# Patient Record
Sex: Female | Born: 1935 | Race: White | Hispanic: No | Smoking: Never smoker
Health system: Southern US, Community
[De-identification: ages and names within clinical notes are randomized; demographics above are authoritative.]

## PROBLEM LIST (undated history)

## (undated) ENCOUNTER — Ambulatory Visit: Admission: EM | Discharge status: Against Medical Advice | Payer: Medicare HMO

## (undated) DIAGNOSIS — I10 Essential (primary) hypertension: Secondary | ICD-10-CM

## (undated) DIAGNOSIS — R51 Headache: Secondary | ICD-10-CM

## (undated) DIAGNOSIS — G2581 Restless legs syndrome: Secondary | ICD-10-CM

## (undated) DIAGNOSIS — R519 Headache, unspecified: Secondary | ICD-10-CM

## (undated) DIAGNOSIS — R7303 Prediabetes: Secondary | ICD-10-CM

## (undated) DIAGNOSIS — N390 Urinary tract infection, site not specified: Secondary | ICD-10-CM

## (undated) DIAGNOSIS — K219 Gastro-esophageal reflux disease without esophagitis: Secondary | ICD-10-CM

## (undated) HISTORY — DX: Gastro-esophageal reflux disease without esophagitis: K21.9

## (undated) HISTORY — DX: Headache, unspecified: R51.9

## (undated) HISTORY — DX: Essential (primary) hypertension: I10

## (undated) HISTORY — DX: Restless legs syndrome: G25.81

## (undated) HISTORY — DX: Prediabetes: R73.03

## (undated) HISTORY — DX: Headache: R51

## (undated) HISTORY — DX: Urinary tract infection, site not specified: N39.0

---

## 1977-12-22 HISTORY — PX: PARTIAL HYSTERECTOMY: SHX80

## 2006-05-05 ENCOUNTER — Ambulatory Visit: Payer: Self-pay | Admitting: Family Medicine

## 2007-07-21 ENCOUNTER — Ambulatory Visit: Payer: Self-pay | Admitting: Family Medicine

## 2007-07-26 ENCOUNTER — Ambulatory Visit: Payer: Self-pay | Admitting: Family Medicine

## 2007-09-24 ENCOUNTER — Encounter: Admission: RE | Admit: 2007-09-24 | Discharge: 2007-09-24 | Payer: Self-pay | Admitting: Surgery

## 2008-06-07 ENCOUNTER — Encounter: Admission: RE | Admit: 2008-06-07 | Discharge: 2008-06-07 | Payer: Self-pay | Admitting: Surgery

## 2009-12-22 HISTORY — PX: BREAST BIOPSY: SHX20

## 2011-01-12 ENCOUNTER — Encounter: Payer: Self-pay | Admitting: Surgery

## 2013-09-15 ENCOUNTER — Telehealth: Payer: Self-pay | Admitting: *Deleted

## 2013-09-15 ENCOUNTER — Emergency Department: Payer: Self-pay | Admitting: Emergency Medicine

## 2013-09-15 DIAGNOSIS — R079 Chest pain, unspecified: Secondary | ICD-10-CM

## 2013-09-15 LAB — COMPREHENSIVE METABOLIC PANEL
Alkaline Phosphatase: 98 U/L (ref 50–136)
Anion Gap: 7 (ref 7–16)
BUN: 24 mg/dL — ABNORMAL HIGH (ref 7–18)
Calcium, Total: 9.3 mg/dL (ref 8.5–10.1)
Chloride: 106 mmol/L (ref 98–107)
Co2: 25 mmol/L (ref 21–32)
EGFR (African American): >60
EGFR (Non-African Amer.): 58 — ABNORMAL LOW
Glucose: 91 mg/dL (ref 65–99)
Potassium: 4 mmol/L (ref 3.5–5.1)
SGPT (ALT): 33 U/L (ref 12–78)
Sodium: 138 mmol/L (ref 136–145)

## 2013-09-15 LAB — TROPONIN I: Troponin-I: <0.02 ng/mL

## 2013-09-15 LAB — CBC
HGB: 14 g/dL (ref 12.0–16.0)
MCH: 31.8 pg (ref 26.0–34.0)

## 2013-09-15 LAB — PROTIME-INR: INR: 0.9

## 2013-09-15 LAB — CK TOTAL AND CKMB (NOT AT ARMC): CK-MB: 1.1 ng/mL (ref 0.5–3.6)

## 2013-09-15 NOTE — Telephone Encounter (Signed)
Per Dr. Mariah Milling, patient in the ER at [facility] today. Will need a GXT tomorrow if she can walk, if not, she will need a formal consult instead. OK to schedule at 4pm per Dr. Mariah Milling. I spoke with the patient, and she is ok to walk. She is agreeable to coming in a 4 pm tomorrow for a GXT. She states she is on no medications. Instructed her to not eat/ drink for 3 hours prior. She voices understanding.

## 2013-09-16 ENCOUNTER — Encounter: Payer: Self-pay | Admitting: Cardiovascular Disease

## 2013-09-19 ENCOUNTER — Encounter: Payer: Self-pay | Admitting: Cardiovascular Disease

## 2017-07-27 ENCOUNTER — Ambulatory Visit (INDEPENDENT_AMBULATORY_CARE_PROVIDER_SITE_OTHER): Payer: Medicare Other | Admitting: Primary Care

## 2017-07-27 ENCOUNTER — Encounter: Payer: Self-pay | Admitting: Primary Care

## 2017-07-27 ENCOUNTER — Ambulatory Visit: Payer: Self-pay | Admitting: Internal Medicine

## 2017-07-27 VITALS — BP 130/84 | HR 94 | Temp 98.4°F | Ht 62.5 in | Wt 171.1 lb

## 2017-07-27 DIAGNOSIS — Z1239 Encounter for other screening for malignant neoplasm of breast: Secondary | ICD-10-CM

## 2017-07-27 DIAGNOSIS — I10 Essential (primary) hypertension: Secondary | ICD-10-CM | POA: Diagnosis not present

## 2017-07-27 DIAGNOSIS — R51 Headache: Secondary | ICD-10-CM

## 2017-07-27 DIAGNOSIS — K219 Gastro-esophageal reflux disease without esophagitis: Secondary | ICD-10-CM | POA: Diagnosis not present

## 2017-07-27 DIAGNOSIS — R519 Headache, unspecified: Secondary | ICD-10-CM | POA: Insufficient documentation

## 2017-07-27 DIAGNOSIS — Z1231 Encounter for screening mammogram for malignant neoplasm of breast: Secondary | ICD-10-CM

## 2017-07-27 MED ORDER — HYDROCHLOROTHIAZIDE 12.5 MG PO TABS: 12.5000 mg | ORAL_TABLET | Freq: Every day | ORAL | 0 refills | Status: DC

## 2017-07-27 MED ORDER — PANTOPRAZOLE SODIUM 20 MG PO TBEC: 20.0000 mg | DELAYED_RELEASE_TABLET | Freq: Every day | ORAL | 0 refills | Status: DC

## 2017-07-27 MED ORDER — TOPIRAMATE 25 MG PO TABS: ORAL_TABLET | ORAL | 0 refills | Status: DC

## 2017-07-27 NOTE — Assessment & Plan Note (Signed)
Stable in the office today, however, do suspect Losartan to be the culprit of her cough. Will stop Losartan, start low dose HCTZ. Follow up in 2 weeks for BP check and BMP.

## 2017-07-27 NOTE — Assessment & Plan Note (Signed)
Long term management on pantoprazole 40 mg. Will reduce dose to 20 mg to see if this is tolerable. She will update if symptoms return.

## 2017-07-27 NOTE — Progress Notes (Signed)
   Subjective:    Patient ID: Renee Gilbert, female    DOB: 03/29/1937, 81 y.o.   MRN: [id number]  HPI  Ms. Renee Gilbert is a 81 year old female who presents today to establish care and discuss the problems mentioned below. Will obtain old records. She is due for her screening mammogram and would like this done soon. Last mammogram completed in [place] and was unremarkable.   1) Essential Hypertension: Currently managed on losartan 50 mg intermittently since 2014, restarted consistent use in 2016. Her BP in the office today is 130/84. She has noticed a tickle to her through with persistent cough during long conversations.   2) GERD: Currently managed on pantoprazole 40 mg. She will experience symptoms of esophageal burning. She's never tried a dose reduction, has always taken this medication. She's not had her pantoprazole today, denies symptoms today.   3) Headaches: Located to the frontal lobe bilaterally for years. Headaches are occurring every other day. She will take Aspirin without improvement. She can't tolerate ibuprofen or Excedrin Migraine.  Never been on management for her chronic headaches. She will experience photophobia, phonophobia intermittently with headaches.    4) Cough: Coughs during a phone conversation or when she's in the middle of a long conversation. Cough present for the past 4-5 months. She underwent xray evaluation last month and was told she had asthma. She denies history of asthma. She denies fevers, chills, chest congestion, fatigue, shortness of breath.  Review of Systems  Constitutional: Negative for fever.  HENT: Negative for congestion and sore throat.   Respiratory: Positive for cough.   Cardiovascular: Negative for chest pain.  Gastrointestinal:       Gerd   Neurological: Positive for headaches.       No past medical history on file.   Social History   Social History  . Marital status: Widowed    Spouse name: N/A  . Number of children: N/A  . Years  of education: N/A   Occupational History  . Not on file.   Social History Main Topics  . Smoking status: Never Smoker  . Smokeless tobacco: Never Used  . Alcohol use No  . Drug use: Unknown  . Sexual activity: Not on file   Other Topics Concern  . Not on file   Social History Narrative  . No narrative on file    No past surgical history on file.  No family history on file.  No Known Allergies  No current outpatient prescriptions on file prior to visit.   No current facility-administered medications on file prior to visit.     BP 130/84   Pulse 94   Temp 98.4 F (36.9 C) (Oral)   Ht 5' 2.5" (1.588 m)   Wt 171 lb 1.9 oz (77.6 kg)   SpO2 93%   BMI 30.80 kg/m    Objective:   Physical Exam  Constitutional: She appears well-nourished. She does not appear ill.  HENT:  Mouth/Throat: Oropharynx is clear and moist.  Neck: Neck supple.  Cardiovascular: Normal rate and regular rhythm.   Pulmonary/Chest: Effort normal and breath sounds normal. She has no wheezes.  Skin: Skin is warm and dry.  Psychiatric: She has a normal mood and affect.          Assessment & Plan:

## 2017-07-27 NOTE — Patient Instructions (Signed)
Stop losartan 50 mg tablets for blood pressure, this is likely causing your cough.  Start hydrochlorothiazide 12.5 mg for blood pressure. Take 1 tablet by mouth once daily.  We've reduced the dose of your acid reflux medication, pantoprazole, to 20 mg. Please call me if you notice a return in your symptoms.  Start topirimate 25 mg tablets for chronic headaches. Take 1 tablet by mouth at bedtime for headache prevention. Start this in 5 days to prevent potential side effects with starting hydrochlorothiazide for blood pressure.  Schedule a follow up visit with me in 2 weeks for blood pressure check.  It was a pleasure to meet you today! Please don't hesitate to call me with any questions. Welcome to [facility]!

## 2017-07-27 NOTE — Assessment & Plan Note (Addendum)
Daily to every other day headaches for years, worse since moving to [place]. Discussed options for treatment, we will start with low dose Topamax HS. Discussed potential side effects. She will start this 5 days after starting HCTZ. Follow up in 2 weeks for re-evaluation.

## 2017-08-12 ENCOUNTER — Ambulatory Visit: Payer: Medicare Other | Admitting: Primary Care

## 2017-08-17 ENCOUNTER — Telehealth: Payer: Self-pay

## 2017-08-17 DIAGNOSIS — I10 Essential (primary) hypertension: Secondary | ICD-10-CM

## 2017-08-17 NOTE — Telephone Encounter (Signed)
Pt left v/m; pt established care on 07/27/17 and BP med changed to HCTZ which makes pt sick. Pt wants to d/c HCTZ and either take losartan or lisinopril. Pt had f/u appt on 08/12/17 but pt cancelled due to being to sick to come in. No future appt scheduled.Please advise.[facility].

## 2017-08-17 NOTE — Telephone Encounter (Signed)
Left message on patient's voicemail to return call

## 2017-08-17 NOTE — Telephone Encounter (Signed)
Please have her stop HCTZ and schedule a follow up visit in my office to discuss other options. Did the cough improve after she stopped Losartan?

## 2017-08-18 MED ORDER — LOSARTAN POTASSIUM 50 MG PO TABS: 50.0000 mg | ORAL_TABLET | Freq: Every day | ORAL | 0 refills | Status: DC

## 2017-08-18 NOTE — Telephone Encounter (Signed)
Noted, Rx sent to pharmacy. 

## 2017-08-18 NOTE — Telephone Encounter (Signed)
Spoken to patient. Patient stated that she have not been coughing. Patient stated that she is unable to schedule an appointment at this time.  Patient ask for Jae Dire to send in the Lisinopril or Losartan to the pharmacy for her.  Patient stated that she will schedule a office visit as soon as she can. She stated that if she have problems, she will let Jae Dire know.

## 2017-08-18 NOTE — Telephone Encounter (Signed)
Please notify patient that the losartan was likely the cause of her cough, this is a common side effect of this medication. If the cough is tolerable then we can restart her losartan, but I do recommend we consider other treatment if the cough is bothersome. Please let me know what she decides.

## 2017-08-18 NOTE — Telephone Encounter (Signed)
Spoken and notified patient of Kate's comments. Patient verbalized understanding.  Patient stated that she is agreeable to re-start the Losartan and patient ask to send to [facility] in [facility]

## 2017-10-22 ENCOUNTER — Other Ambulatory Visit: Payer: Self-pay | Admitting: Primary Care

## 2017-10-22 DIAGNOSIS — K219 Gastro-esophageal reflux disease without esophagitis: Secondary | ICD-10-CM

## 2017-11-23 ENCOUNTER — Other Ambulatory Visit: Payer: Self-pay | Admitting: Primary Care

## 2017-11-23 DIAGNOSIS — I10 Essential (primary) hypertension: Secondary | ICD-10-CM

## 2017-11-23 NOTE — Telephone Encounter (Signed)
Copied from CRM [id number]. Topic: General - Other >> Nov 23, 2017  5:43 PM Stephannie LiSimmons, Nieves Chapa L, NT wrote: [facility] [place] called in regards to losartin needing to be renewed  please call [phone] fax # [phone]

## 2017-11-24 MED ORDER — LOSARTAN POTASSIUM 50 MG PO TABS: 50.0000 mg | ORAL_TABLET | Freq: Every day | ORAL | 1 refills | Status: DC

## 2017-11-24 NOTE — Telephone Encounter (Signed)
Refill sent to pharmacy.   

## 2017-11-24 NOTE — Telephone Encounter (Signed)
Pt established care 07/27/17; please see 08/17/17 phone note; pt has not rescheduled f/u appt.Please advise.

## 2018-04-22 ENCOUNTER — Other Ambulatory Visit: Payer: Self-pay | Admitting: Primary Care

## 2018-04-22 DIAGNOSIS — K219 Gastro-esophageal reflux disease without esophagitis: Secondary | ICD-10-CM

## 2018-04-27 ENCOUNTER — Other Ambulatory Visit: Payer: Self-pay | Admitting: Primary Care

## 2018-04-27 DIAGNOSIS — K219 Gastro-esophageal reflux disease without esophagitis: Secondary | ICD-10-CM

## 2018-04-30 ENCOUNTER — Telehealth: Payer: Self-pay | Admitting: Primary Care

## 2018-04-30 DIAGNOSIS — K219 Gastro-esophageal reflux disease without esophagitis: Secondary | ICD-10-CM

## 2018-04-30 MED ORDER — PANTOPRAZOLE SODIUM 20 MG PO TBEC: 20.0000 mg | DELAYED_RELEASE_TABLET | Freq: Every day | ORAL | 0 refills | Status: DC

## 2018-04-30 NOTE — Telephone Encounter (Signed)
Copied from CRM [id number]. Topic: Quick Communication - Rx Refill/Question >> Apr 30, 2018 12:11 PM Alexander Bergeron B wrote: Medication: pantoprazole (PROTONIX) 20 MG tablet [[id number]]  Has the patient contacted their pharmacy? Yes.   (Agent: If no, request that the patient contact the pharmacy for the refill.) Preferred Pharm[facility] (with phone [facility] [place], [place] Agent: Please be advised that RX refills may take up to 3 business days. We ask that you follow-up with your pharmacy.

## 2018-07-24 ENCOUNTER — Other Ambulatory Visit: Payer: Self-pay | Admitting: Primary Care

## 2018-07-24 DIAGNOSIS — K219 Gastro-esophageal reflux disease without esophagitis: Secondary | ICD-10-CM

## 2018-07-28 ENCOUNTER — Other Ambulatory Visit: Payer: Self-pay | Admitting: Primary Care

## 2018-07-28 DIAGNOSIS — K219 Gastro-esophageal reflux disease without esophagitis: Secondary | ICD-10-CM

## 2018-07-30 ENCOUNTER — Other Ambulatory Visit: Payer: Self-pay | Admitting: Primary Care

## 2018-07-30 DIAGNOSIS — K219 Gastro-esophageal reflux disease without esophagitis: Secondary | ICD-10-CM

## 2018-11-04 ENCOUNTER — Other Ambulatory Visit: Payer: Self-pay | Admitting: Primary Care

## 2018-11-04 DIAGNOSIS — K219 Gastro-esophageal reflux disease without esophagitis: Secondary | ICD-10-CM

## 2018-11-04 NOTE — Telephone Encounter (Signed)
Name of Medication: pantoprazole 20 mg Name of Pharmacy: [facility] Last Fill or Written Date and Quantity: # 30 on 07/30/18 Last Office Visit and Type:07/27/17 established care  Next Office Visit and Type: none scheduled  Per 07/27/2017 office note pt was to FU appt in 2 weeks. No future appt scheduled. Please advise.

## 2018-11-05 NOTE — Telephone Encounter (Signed)
Refill request denied. Patient overdue for follow up and has not done so.

## 2018-11-17 ENCOUNTER — Ambulatory Visit: Payer: Medicare Other | Admitting: Primary Care

## 2018-11-17 ENCOUNTER — Telehealth: Payer: Self-pay

## 2018-11-17 NOTE — Telephone Encounter (Signed)
Patient called to cancel her appointment today with Jae DireKate and has it rescheduled for 11/24/18 but as it is for bp issues and leg pain, I will reach out to patient to be sure she is okay and doesn't need sooner f/u.

## 2018-11-17 NOTE — Telephone Encounter (Addendum)
Left message on cell VM (okay per DPR) for patient to please call back to discuss her symptoms to determine if patient may require a more urgent eval than to wait until 11/24/18.    She is on schedule for bp issues/leg pain with Jae DireKate for 12/4/ after calling and cancelling her appointment for today.

## 2018-11-24 ENCOUNTER — Telehealth: Payer: Self-pay

## 2018-11-24 ENCOUNTER — Ambulatory Visit (INDEPENDENT_AMBULATORY_CARE_PROVIDER_SITE_OTHER): Payer: Medicare Other | Admitting: Primary Care

## 2018-11-24 VITALS — BP 122/80 | HR 71 | Temp 98.1°F | Ht 62.5 in | Wt 177.8 lb

## 2018-11-24 DIAGNOSIS — I10 Essential (primary) hypertension: Secondary | ICD-10-CM

## 2018-11-24 DIAGNOSIS — R519 Headache, unspecified: Secondary | ICD-10-CM

## 2018-11-24 DIAGNOSIS — R252 Cramp and spasm: Secondary | ICD-10-CM

## 2018-11-24 DIAGNOSIS — K219 Gastro-esophageal reflux disease without esophagitis: Secondary | ICD-10-CM

## 2018-11-24 DIAGNOSIS — R202 Paresthesia of skin: Secondary | ICD-10-CM | POA: Diagnosis not present

## 2018-11-24 DIAGNOSIS — R200 Anesthesia of skin: Secondary | ICD-10-CM

## 2018-11-24 DIAGNOSIS — R51 Headache: Secondary | ICD-10-CM | POA: Diagnosis not present

## 2018-11-24 LAB — COMPREHENSIVE METABOLIC PANEL
ALT: 27 U/L (ref 0–35)
AST: 21 U/L (ref 0–37)
Albumin: 4.5 g/dL (ref 3.5–5.2)
Alkaline Phosphatase: 63 U/L (ref 39–117)
BUN: 25 mg/dL — AB (ref 6–23)
CO2: 26 mEq/L (ref 19–32)
CREATININE: 1.02 mg/dL (ref 0.40–1.20)
Calcium: 10.4 mg/dL (ref 8.4–10.5)
Chloride: 100 mEq/L (ref 96–112)
GFR: 55.33 mL/min — ABNORMAL LOW (ref >60.00)
GLUCOSE: 112 mg/dL — AB (ref 70–99)
POTASSIUM: 3.9 meq/L (ref 3.5–5.1)
SODIUM: 136 meq/L (ref 135–145)
TOTAL PROTEIN: 7.4 g/dL (ref 6.0–8.3)
Total Bilirubin: 0.5 mg/dL (ref 0.2–1.2)

## 2018-11-24 LAB — CBC
HEMATOCRIT: 38.7 % (ref 36.0–46.0)
Hemoglobin: 13.4 g/dL (ref 12.0–15.0)
MCHC: 34.7 g/dL (ref 30.0–36.0)
MCV: 90.9 fl (ref 78.0–100.0)
Platelets: 271 10*3/uL (ref 150.0–400.0)
RBC: 4.25 Mil/uL (ref 3.87–5.11)
RDW: 12.9 % (ref 11.5–15.5)
WBC: 7.4 10*3/uL (ref 4.0–10.5)

## 2018-11-24 LAB — VITAMIN B12: Vitamin B-12: 866 pg/mL (ref 211–911)

## 2018-11-24 MED ORDER — LOSARTAN POTASSIUM-HCTZ 100-25 MG PO TABS: 1.0000 | ORAL_TABLET | Freq: Every day | ORAL | 3 refills | Status: DC

## 2018-11-24 MED ORDER — MELOXICAM 15 MG PO TABS: ORAL_TABLET | ORAL | 1 refills | Status: DC

## 2018-11-24 MED ORDER — HYDROCHLOROTHIAZIDE 25 MG PO TABS: 25.0000 mg | ORAL_TABLET | Freq: Every day | ORAL | 3 refills | Status: DC

## 2018-11-24 MED ORDER — LOSARTAN POTASSIUM 100 MG PO TABS
100.0000 mg | ORAL_TABLET | Freq: Every day | ORAL | 3 refills | Status: DC
Start: 2018-11-24 — End: 2019-11-21

## 2018-11-24 MED ORDER — PANTOPRAZOLE SODIUM 40 MG PO TBEC: 40.0000 mg | DELAYED_RELEASE_TABLET | Freq: Every day | ORAL | 3 refills | Status: DC

## 2018-11-24 NOTE — Assessment & Plan Note (Signed)
Never picked up Topamax, does well with daily Ibuprofen. Discussed to stop Ibuprofen as we will be sending in Meloxicam.

## 2018-11-24 NOTE — Telephone Encounter (Signed)
Noted.  2 separate prescriptions sent to pharmacy.

## 2018-11-24 NOTE — Patient Instructions (Signed)
Stop by the lab prior to leaving today. I will notify you of your results once received.   We've increased the dose of your pantoprazole to 40 mg, this will help with heartburn.  Do not take Ibuprofen when taking Meloxicam. The Meloxicam can be taken once daily for pain/cramping. You may take Tylenol if needed.  It was a pleasure to see you today!

## 2018-11-24 NOTE — Progress Notes (Signed)
Subjective:    Patient ID: Renee Gilbert, female    DOB: 08/21/1936, 82 y.o.   MRN: [id number]  HPI  Renee Gilbert is an 82 year old female who was last seen in August 2018 who presents today with a chief complaint of lower extremity pain. She is also needing medication refills. She spends a lot of time in [place], has a PCP there as well.   BP Readings from Last 3 Encounters:  11/24/18 122/80  07/27/17 130/84   She reports bilateral lower extremity cramping that occurs most nights below the knees. This has been present for the last one year. She will wear compression socks at night which helps with cramping. She will take Ibuprofen 600 mg once nightly with improvement. She's taken some of her sister's Meloxicam 15 mg tablets once daily over the last two weeks and has found it to be very helpful with her cramping. She would like a prescription of her own.   She never picked up the prescription for topiramate, does continue to have daily headaches but will take Ibuprofen with relief.   Review of Systems  Eyes: Negative for visual disturbance.  Respiratory: Negative for shortness of breath.   Cardiovascular: Negative for chest pain.  Musculoskeletal: Positive for myalgias.  Neurological: Positive for headaches. Negative for dizziness.       Past Medical History:  Diagnosis Date  . Essential hypertension   . Frequent headaches   . GERD (gastroesophageal reflux disease)      Social History   Socioeconomic History  . Marital status: Widowed    Spouse name: Not on file  . Number of children: Not on file  . Years of education: Not on file  . Highest education level: Not on file  Occupational History  . Not on file  Social Needs  . Financial resource strain: Not on file  . Food insecurity:    Worry: Not on file    Inability: Not on file  . Transportation needs:    Medical: Not on file    Non-medical: Not on file  Tobacco Use  . Smoking status: Never Smoker  . Smokeless  tobacco: Never Used  Substance and Sexual Activity  . Alcohol use: No  . Drug use: Not on file  . Sexual activity: Not on file  Lifestyle  . Physical activity:    Days per week: Not on file    Minutes per session: Not on file  . Stress: Not on file  Relationships  . Social connections:    Talks on phone: Not on file    Gets together: Not on file    Attends religious service: Not on file    Active member of club or organization: Not on file    Attends meetings of clubs or organizations: Not on file    Relationship status: Not on file  . Intimate partner violence:    Fear of current or ex partner: Not on file    Emotionally abused: Not on file    Physically abused: Not on file    Forced sexual activity: Not on file  Other Topics Concern  . 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 Breast cancer Sister     No Known Allergies  Current Outpatient Medications on File Prior to Visit  Medication Sig Dispense  Refill  . ibuprofen (ADVIL,MOTRIN) 600 MG tablet Take 600 mg by mouth every 6 (six) hours as needed.    . topiramate (TOPAMAX) 25 MG tablet Take 1 tablet by mouth at bedtime for headache prevention. (Patient not taking: Reported on 11/24/2018) 90 tablet 0  . [DISCONTINUED] pantoprazole (PROTONIX) 20 MG tablet Take 1 tablet (20 mg total) by mouth daily. NEED APPOINTMENT FOR ANY MORE REFILLS 30 tablet 0   No current facility-administered medications on file prior to visit.     BP 122/80   Pulse 71   Temp 98.1 F (36.7 C) (Oral)   Ht 5' 2.5" (1.588 m)   Wt 177 lb 12 oz (80.6 kg)   SpO2 90%   BMI 31.99 kg/m    Objective:   Physical Exam  Constitutional: She is oriented to person, place, and time. She appears well-nourished.  Neck: Neck supple.  Cardiovascular: Normal rate and regular rhythm.  Pulses:      Dorsalis pedis pulses are 2+ on the right  side, and 2+ on the left side.       Posterior tibial pulses are 2+ on the right side, and 2+ on the left side.  Respiratory: Effort normal and breath sounds normal.  Musculoskeletal:  5/5 strength to bilateral lower extremities.   Neurological: She is alert and oriented to person, place, and time.  Skin: Skin is warm and dry.  Psychiatric: She has a normal mood and affect.           Assessment & Plan:

## 2018-11-24 NOTE — Assessment & Plan Note (Signed)
Stable in the office today. Is actually on losartan 100 mg and HCTZ 25 mg. Will refill today. BMP pending.

## 2018-11-24 NOTE — Telephone Encounter (Signed)
Nikki with [facility] said losartan HCTZ 100-25 mg is on BO and request 2 separate rx for losartan 100 mg and HCTZ 25 mg.

## 2018-11-24 NOTE — Assessment & Plan Note (Signed)
Chronic x 1 year, improved with compression socks and Meloxicam. No signs of intermittent claudication. Pedal pulses 2+ bilaterally.   Will send in meloxicam 15 mg, discussed to stop Ibuprofen. She will update. Labs pending including CMP, B12, CBC, A1C.

## 2018-11-24 NOTE — Assessment & Plan Note (Signed)
Continues to experience breakthrough esophageal burning on pantoprazole 20 mg. Will increase her dose of pantoprazole to 40 mg, this has been effective in the past.

## 2018-11-25 ENCOUNTER — Telehealth: Payer: Self-pay | Admitting: *Deleted

## 2018-11-25 NOTE — Telephone Encounter (Signed)
-----   Message from Doreene NestKatherine K Clark, NP sent at 11/24/2018  5:27 PM EST ----- Please notify patient:  Liver, electrolytes, vitamin B12, blood counts look good. There is a slight decrease in renal function, I do not have any recent prior labs to compare.  Medications like ibuprofen and meloxicam if taken often can be harmful to the kidneys.  Kidney function can decrease as we age in general. I recommend she discuss this with her primary care provider in [place] to see if she has a history of chronic kidney disease.  I would use caution with the meloxicam.

## 2018-11-25 NOTE — Telephone Encounter (Signed)
Message left for patient to return my call.  

## 2018-11-29 NOTE — Telephone Encounter (Signed)
Pt returned call to Chan. °

## 2018-11-29 NOTE — Telephone Encounter (Signed)
Spoken and notified patient of Kate Clark's comments. Patient verbalized understanding.  

## 2018-11-30 ENCOUNTER — Telehealth: Payer: Self-pay | Admitting: Primary Care

## 2018-11-30 DIAGNOSIS — R252 Cramp and spasm: Secondary | ICD-10-CM

## 2018-11-30 MED ORDER — GABAPENTIN 100 MG PO CAPS: 100.0000 mg | ORAL_CAPSULE | Freq: Every day | ORAL | 0 refills | Status: DC

## 2018-11-30 NOTE — Telephone Encounter (Signed)
Spoken and notified patient of Kate Clark's comments. Patient verbalized understanding.  

## 2018-11-30 NOTE — Telephone Encounter (Signed)
Best number [phone]  Pt called wanting to talk to kate Regarding her medication for pain

## 2018-11-30 NOTE — Telephone Encounter (Signed)
Please notify patient I will send in a prescription for gabapentin to the pharmacy for her pain.  She can take anywhere from 1 to 3 capsules at bedtime, have her start with 1.  This medication may make her drowsy.

## 2018-11-30 NOTE — Telephone Encounter (Signed)
Spoken to patient stated that she would like to know if Mayra ReelKate Clark can prescribed something for pain since she cannot take ibuprofen and meloxicam. Just a small supply. She will be going on the airplane day after tomorrow and need something for the flight. She will discuss with her other doctor regarding what she was told from us.

## 2019-11-21 ENCOUNTER — Other Ambulatory Visit: Payer: Self-pay | Admitting: Primary Care

## 2019-11-21 DIAGNOSIS — I10 Essential (primary) hypertension: Secondary | ICD-10-CM

## 2019-11-21 DIAGNOSIS — K219 Gastro-esophageal reflux disease without esophagitis: Secondary | ICD-10-CM

## 2019-12-24 ENCOUNTER — Other Ambulatory Visit: Payer: Self-pay | Admitting: Primary Care

## 2019-12-24 DIAGNOSIS — I10 Essential (primary) hypertension: Secondary | ICD-10-CM

## 2019-12-24 DIAGNOSIS — K219 Gastro-esophageal reflux disease without esophagitis: Secondary | ICD-10-CM

## 2019-12-27 ENCOUNTER — Other Ambulatory Visit: Payer: Self-pay | Admitting: Primary Care

## 2019-12-27 DIAGNOSIS — K219 Gastro-esophageal reflux disease without esophagitis: Secondary | ICD-10-CM

## 2019-12-27 DIAGNOSIS — I10 Essential (primary) hypertension: Secondary | ICD-10-CM

## 2019-12-29 ENCOUNTER — Other Ambulatory Visit: Payer: Self-pay | Admitting: Primary Care

## 2019-12-29 DIAGNOSIS — K219 Gastro-esophageal reflux disease without esophagitis: Secondary | ICD-10-CM

## 2019-12-29 DIAGNOSIS — I10 Essential (primary) hypertension: Secondary | ICD-10-CM

## 2020-04-10 ENCOUNTER — Ambulatory Visit (INDEPENDENT_AMBULATORY_CARE_PROVIDER_SITE_OTHER): Payer: Medicare HMO | Admitting: Primary Care

## 2020-04-10 ENCOUNTER — Other Ambulatory Visit: Payer: Self-pay

## 2020-04-10 ENCOUNTER — Encounter: Payer: Self-pay | Admitting: Primary Care

## 2020-04-10 ENCOUNTER — Telehealth: Payer: Self-pay

## 2020-04-10 VITALS — BP 150/90 | HR 76 | Temp 97.2°F | Ht 62.5 in | Wt 176.5 lb

## 2020-04-10 DIAGNOSIS — R921 Mammographic calcification found on diagnostic imaging of breast: Secondary | ICD-10-CM | POA: Diagnosis not present

## 2020-04-10 DIAGNOSIS — I10 Essential (primary) hypertension: Secondary | ICD-10-CM | POA: Diagnosis not present

## 2020-04-10 DIAGNOSIS — R7303 Prediabetes: Secondary | ICD-10-CM

## 2020-04-10 DIAGNOSIS — K219 Gastro-esophageal reflux disease without esophagitis: Secondary | ICD-10-CM | POA: Diagnosis not present

## 2020-04-10 DIAGNOSIS — R300 Dysuria: Secondary | ICD-10-CM | POA: Insufficient documentation

## 2020-04-10 DIAGNOSIS — R252 Cramp and spasm: Secondary | ICD-10-CM

## 2020-04-10 LAB — BASIC METABOLIC PANEL
BUN: 24 mg/dL — ABNORMAL HIGH (ref 6–23)
CO2: 27 mEq/L (ref 19–32)
Calcium: 9.4 mg/dL (ref 8.4–10.5)
Chloride: 98 mEq/L (ref 96–112)
Creatinine, Ser: 1.05 mg/dL (ref 0.40–1.20)
GFR: 50.17 mL/min — ABNORMAL LOW (ref >60.00)
Glucose, Bld: 107 mg/dL — ABNORMAL HIGH (ref 70–99)
Potassium: 4 mEq/L (ref 3.5–5.1)
Sodium: 133 mEq/L — ABNORMAL LOW (ref 135–145)

## 2020-04-10 LAB — POC URINALSYSI DIPSTICK (AUTOMATED)
Bilirubin, UA: NEGATIVE
Blood, UA: NEGATIVE
Glucose, UA: NEGATIVE
Ketones, UA: NEGATIVE
Nitrite, UA: NEGATIVE
Protein, UA: NEGATIVE
Spec Grav, UA: 1.025 (ref 1.010–1.025)
Urobilinogen, UA: 0.2 E.U./dL
pH, UA: 6 (ref 5.0–8.0)

## 2020-04-10 MED ORDER — LOSARTAN POTASSIUM 100 MG PO TABS: 100.0000 mg | ORAL_TABLET | Freq: Every day | ORAL | 3 refills | Status: DC

## 2020-04-10 MED ORDER — PANTOPRAZOLE SODIUM 40 MG PO TBEC: 40.0000 mg | DELAYED_RELEASE_TABLET | Freq: Every day | ORAL | 3 refills | Status: DC

## 2020-04-10 MED ORDER — SULFAMETHOXAZOLE-TRIMETHOPRIM 800-160 MG PO TABS: 1.0000 | ORAL_TABLET | Freq: Two times a day (BID) | ORAL | 0 refills | Status: DC

## 2020-04-10 NOTE — Telephone Encounter (Signed)
Added

## 2020-04-10 NOTE — Assessment & Plan Note (Signed)
Above goal in the office today, has not had amlodipine 5 mg, only losartan 100 mg. Also not checking BP at home. She has not had HCTZ in years.  Discussed to start monitoring BP at home on both amlodipine 5 mg and losartan 100 mg. She will notify if readings are consistently at or above 140/90.

## 2020-04-10 NOTE — Assessment & Plan Note (Signed)
Acute for the last 2 days, history of UTI with E coli. UA today with 1+ leuks, very foul smell. Culture pending.  Treat with Bactrim DS tablets.

## 2020-04-10 NOTE — Progress Notes (Signed)
Subjective:    Patient ID: Renee Gilbert, female    DOB: June 28, 1936, 84 y.o.   MRN: [id number]  HPI  This visit occurred during the SARS-CoV-2 public health emergency.  Safety protocols were in place, including screening questions prior to the visit, additional usage of staff PPE, and extensive cleaning of exam room while observing appropriate contact time as indicated for disinfecting solutions.   Renee Gilbert is a 84 year old female who primarily resides in [place], visits [place] for family. She is here today needing a diagnostic mammogram and follow up.  1) Abnormal Mammogram: Needing diagnostic mammogram. She is established as a patient at [facility] in [place], had a mammogram in March 2021 which showed "indeterminate clusters of calcifications in the right breast" that required additional imaging. She did not undergo additional imaging in [place] so she is needing this done in [place]. Report is in Care Everywhere.  She had her last Covid-19 vaccine on 02/18/20. History of breast cancer in her sister.  2) Dysuria: Also with urinary frequency. History of recurrent urinary tract infections, last one being one year ago. She denies hematuria, pelvic pressure, vaginal itching/discharge. Symptoms began two days ago. Her sister provided her with 1/2 tablet of an unknown antibiotic.   3) Essential Hypertension: Currently managed on amlodipine 5 mg, losartan 100 mg, and HCTZ 25 mg. She has only taken her losartan 100 mg today, did not take amlodipine 5 mg, and has not taken HCTZ in over one year.   She got to [place] two weeks ago, has not checked BP recently. She has checked her BP intermittently in [place], not sure what she was running.   BP Readings from Last 3 Encounters:  04/10/20 (!) 150/90  11/24/18 122/80  07/27/17 130/84   4) Restless Legs/Lower Extremity Cramping: Currently managed on gabapentin 100 mg for which she takes most every  night. She continues to notice lower extremity cramping which began about three weeks ago. She will wake during the night and place elastic stockings to her feet with improvement.   5) GERD: Chronic for years, managed on pantoprazole 40 mg, does have breakthrough symptoms every other day. She's never had an endoscopy and doesn't wish to see GI.   6) Prediabetes: A1C of 6.2 in February 2021, noted through Care Everywhere.  Review of Systems  Respiratory: Negative for shortness of breath.   Cardiovascular: Negative for chest pain.  Gastrointestinal:       GERD  Musculoskeletal: Positive for arthralgias and myalgias.  Neurological: Negative for dizziness and headaches.       Past Medical History:  Diagnosis Date  . Essential hypertension   . Frequent headaches   . GERD (gastroesophageal reflux disease)      Social History   Socioeconomic History  . Marital status: Widowed    Spouse name: Not on file  . Number of children: Not on file  . Years of education: Not on file  . Highest education level: Not on file  Occupational History  . Not on file  Tobacco Use  . Smoking status: Never Smoker  . Smokeless tobacco: Never Used  Substance and Sexual Activity  . Alcohol use: No  . Drug use: Not on file  . Sexual activity: Not on file  Other Topics Concern  . 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 Difficulty of Paying Living Expenses:   Food Insecurity:   . Worried About Charity fundraiser in the Last Year:   . Arboriculturist in the Last Year:   Transportation Needs:   . Film/video editor (Medical):   Marland Kitchen Lack of Transportation (Non-Medical):   Physical Activity:   . Days of Exercise per Week:   . Minutes of Exercise per Session:   Stress:   . Feeling of Stress :   Social Connections:   . Frequency of Communication with Friends and  Family:   . Frequency of Social Gatherings with Friends and Family:   . Attends Religious Services:   . Active Member of Clubs or Organizations:   . Attends Archivist Meetings:   Marland Kitchen Marital Status:   Intimate Partner Violence:   . Fear of Current or Ex-Partner:   . Emotionally Abused:   Marland Kitchen Physically Abused:   . Sexually Abused:    z Family History  Problem Relation Age of Onset  . Breast cancer Sister     No Known Allergies  Current Outpatient Medications on File Prior to Visit  Medication Sig Dispense Refill  . amLODipine (NORVASC) 5 MG tablet Take by mouth.    . gabapentin (NEURONTIN) 100 MG capsule Take 1-3 capsules (100-300 mg total) by mouth at bedtime. For pain. 90 capsule 0  . losartan (COZAAR) 100 MG tablet Take 1 tablet (100 mg total) by mouth daily. NEED APPOINTMENT FOR ANY MORE REFILLS 30 tablet 0   No current facility-administered medications on file prior to visit.    BP (!) 150/90   Pulse 76   Temp (!) 97.2 F (36.2 C) (Temporal)   Ht 5' 2.5" (1.588 m)   Wt 176 lb 8 oz (80.1 kg)   SpO2 98%   BMI 31.77 kg/m    Objective:   Physical Exam  Constitutional: She appears well-nourished.  Cardiovascular: Normal rate and regular rhythm.  Respiratory: Effort normal and breath sounds normal.  Musculoskeletal:     Cervical back: Neck supple.  Skin: Skin is warm and dry.  Psychiatric: She has a normal mood and affect.           Assessment & Plan:

## 2020-04-10 NOTE — Assessment & Plan Note (Signed)
Mammogram from February 2021 reviewed through Care Everywhere, orders for diagnostic mammogram and ultrasound placed.

## 2020-04-10 NOTE — Assessment & Plan Note (Addendum)
Chronic high dose PPI use, also with breakthrough GERD symptoms. Recommended GI evaluation given breakthrough symptoms, she kindly declines.  Refill sent to pharmacy.

## 2020-04-10 NOTE — Assessment & Plan Note (Signed)
A1C of 6.2 from February 2021, reviewed in Care Everywhere. Discussed to work on diet and exercise.

## 2020-04-10 NOTE — Telephone Encounter (Signed)
Please add [id number] fo mamo/breast u/s.

## 2020-04-10 NOTE — Patient Instructions (Addendum)
Increase your gabapentin medication to 2 capsules at bedtime for cramping.  Stretch your legs before you go to bed each night.  Start Bactrim DS (sulfamethoxazole/trimethoprim) tablets for urinary tract infection. Take 1 tablet by mouth twice daily for 5 days.  Stop by the lab prior to leaving today. I will notify you of your results once received.   You will be contacted regarding your mammogram.  Please let us know if you have not been contacted within one week.   Start monitoring your blood pressure daily, around the same time of day, for the next 2-3 weeks.  Ensure that you have rested for 30 minutes prior to checking your blood pressure. Record your readings and notify me if you see readings at or above 140/90.   It was a pleasure to see you today!

## 2020-04-10 NOTE — Assessment & Plan Note (Signed)
Continued with recent bout being three weeks ago. Discussed stretching nightly before bed. Repeat BMP pending.  Increase gabapentin to 200-300 HS.  She will update.  Other labs from February 2021 reviewed through [facility].

## 2020-04-11 LAB — URINE CULTURE
MICRO NUMBER:: 10384410
SPECIMEN QUALITY:: ADEQUATE

## 2020-04-18 ENCOUNTER — Encounter: Payer: Self-pay | Admitting: *Deleted

## 2020-05-14 ENCOUNTER — Other Ambulatory Visit: Payer: Self-pay | Admitting: Primary Care

## 2020-05-14 DIAGNOSIS — R252 Cramp and spasm: Secondary | ICD-10-CM

## 2020-05-14 NOTE — Telephone Encounter (Signed)
Message left for patient to return my call.  

## 2020-05-14 NOTE — Telephone Encounter (Signed)
How's she doing since we increased the dose to 200-300 mg? How much is she taking? Does she need a refill?

## 2020-05-14 NOTE — Telephone Encounter (Signed)
Last prescribed on 11/30/2018 . Last OV on 04/10/2020. Patient was in [place] most of last year. No future OV scheduled

## 2020-05-15 NOTE — Telephone Encounter (Signed)
Spoken to patient and she stated that most nights she take 200 mg, it is once in a while 300 mg. Yes, patient would like a refill.

## 2020-05-15 NOTE — Telephone Encounter (Signed)
Noted, refill sent to pharmacy. 

## 2020-05-17 ENCOUNTER — Other Ambulatory Visit: Payer: Self-pay | Admitting: Primary Care

## 2020-05-17 ENCOUNTER — Other Ambulatory Visit: Payer: Medicare HMO

## 2020-05-17 ENCOUNTER — Ambulatory Visit
Admission: RE | Admit: 2020-05-17 | Discharge: 2020-05-17 | Disposition: A | Discharge status: Home / Self Care | Payer: Medicare HMO | Source: Ambulatory Visit | Attending: Primary Care | Admitting: Primary Care

## 2020-05-17 DIAGNOSIS — R921 Mammographic calcification found on diagnostic imaging of breast: Secondary | ICD-10-CM

## 2020-05-17 DIAGNOSIS — R928 Other abnormal and inconclusive findings on diagnostic imaging of breast: Secondary | ICD-10-CM

## 2020-05-23 ENCOUNTER — Telehealth: Payer: Self-pay | Admitting: Primary Care

## 2020-05-23 NOTE — Telephone Encounter (Signed)
Called Dr Arvella Nigh office and spoke with Chrissie Noa. She looked at the [facility] report and the recommendation is for a Stereotactic Biopsy X2 , you cant do these in an office setting they have to be done in the hospital. I called the patient and explained it to her that they must be done with special equipment in the hospital setting. She will move forward with the  Biopsy's that are scheduled for this Friday

## 2020-05-23 NOTE — Telephone Encounter (Signed)
Do you know of any other options for patient? I know do not biopsy in the office.

## 2020-05-23 NOTE — Telephone Encounter (Signed)
We cannot do the biopsy in our office. She could be referred to a general surgeon like Dr. Birdie Sons? Shirlee Limerick may know.

## 2020-05-23 NOTE — Telephone Encounter (Signed)
Noted and thank you

## 2020-05-23 NOTE — Telephone Encounter (Signed)
Patient called.  Patient said she's having a biopsy done at [facility] on Friday. Patient said she called [facility] to find out how much it would be and they told her to call [employer].  Monia Pouch said the cost could be from $30 - $300 or it could be more depending on the service.  Patient wants to know if the charges are because it's being done at a Hospital.  She wants to know if she can have the biopsy done at an office where it might be cheaper.

## 2020-05-25 ENCOUNTER — Ambulatory Visit
Admission: RE | Admit: 2020-05-25 | Discharge: 2020-05-25 | Disposition: A | Discharge status: Home / Self Care | Payer: Medicare HMO | Source: Ambulatory Visit | Attending: Primary Care | Admitting: Primary Care

## 2020-05-25 DIAGNOSIS — R928 Other abnormal and inconclusive findings on diagnostic imaging of breast: Secondary | ICD-10-CM | POA: Insufficient documentation

## 2020-05-25 DIAGNOSIS — R921 Mammographic calcification found on diagnostic imaging of breast: Secondary | ICD-10-CM | POA: Insufficient documentation

## 2020-05-25 HISTORY — PX: BREAST BIOPSY: SHX20

## 2020-05-28 LAB — SURGICAL PATHOLOGY

## 2020-07-27 ENCOUNTER — Other Ambulatory Visit: Payer: Self-pay | Admitting: Primary Care

## 2020-07-27 DIAGNOSIS — R252 Cramp and spasm: Secondary | ICD-10-CM

## 2020-11-27 ENCOUNTER — Other Ambulatory Visit: Payer: Self-pay | Admitting: Internal Medicine

## 2020-11-27 DIAGNOSIS — R252 Cramp and spasm: Secondary | ICD-10-CM

## 2021-06-06 ENCOUNTER — Ambulatory Visit: Payer: Medicare HMO | Admitting: Primary Care

## 2021-06-11 ENCOUNTER — Ambulatory Visit: Payer: Medicare HMO | Admitting: Primary Care

## 2021-06-27 ENCOUNTER — Other Ambulatory Visit: Payer: Self-pay | Admitting: Primary Care

## 2021-06-27 DIAGNOSIS — K219 Gastro-esophageal reflux disease without esophagitis: Secondary | ICD-10-CM

## 2021-07-11 ENCOUNTER — Other Ambulatory Visit: Payer: Self-pay

## 2021-07-11 ENCOUNTER — Encounter: Payer: Self-pay | Admitting: Primary Care

## 2021-07-11 ENCOUNTER — Ambulatory Visit (INDEPENDENT_AMBULATORY_CARE_PROVIDER_SITE_OTHER): Payer: Medicare HMO | Admitting: Primary Care

## 2021-07-11 VITALS — BP 126/70 | HR 95 | Temp 98.5°F | Ht 62.5 in | Wt 178.0 lb

## 2021-07-11 DIAGNOSIS — I700 Atherosclerosis of aorta: Secondary | ICD-10-CM

## 2021-07-11 DIAGNOSIS — I10 Essential (primary) hypertension: Secondary | ICD-10-CM | POA: Diagnosis not present

## 2021-07-11 DIAGNOSIS — K219 Gastro-esophageal reflux disease without esophagitis: Secondary | ICD-10-CM

## 2021-07-11 DIAGNOSIS — R252 Cramp and spasm: Secondary | ICD-10-CM

## 2021-07-11 DIAGNOSIS — M545 Low back pain, unspecified: Secondary | ICD-10-CM

## 2021-07-11 DIAGNOSIS — G8929 Other chronic pain: Secondary | ICD-10-CM

## 2021-07-11 DIAGNOSIS — R7303 Prediabetes: Secondary | ICD-10-CM | POA: Diagnosis not present

## 2021-07-11 DIAGNOSIS — M549 Dorsalgia, unspecified: Secondary | ICD-10-CM | POA: Insufficient documentation

## 2021-07-11 LAB — COMPREHENSIVE METABOLIC PANEL
ALT: 20 U/L (ref 0–35)
AST: 20 U/L (ref 0–37)
Albumin: 4.1 g/dL (ref 3.5–5.2)
Alkaline Phosphatase: 59 U/L (ref 39–117)
BUN: 21 mg/dL (ref 6–23)
CO2: 25 mEq/L (ref 19–32)
Calcium: 9.6 mg/dL (ref 8.4–10.5)
Chloride: 101 mEq/L (ref 96–112)
Creatinine, Ser: 1.06 mg/dL (ref 0.40–1.20)
GFR: 48.66 mL/min — ABNORMAL LOW (ref >60.00)
Glucose, Bld: 98 mg/dL (ref 70–99)
Potassium: 3.9 mEq/L (ref 3.5–5.1)
Sodium: 135 mEq/L (ref 135–145)
Total Bilirubin: 0.4 mg/dL (ref 0.2–1.2)
Total Protein: 6.7 g/dL (ref 6.0–8.3)

## 2021-07-11 LAB — LIPID PANEL
Cholesterol: 211 mg/dL — ABNORMAL HIGH (ref 0–200)
HDL: 51.7 mg/dL (ref >39.00)
NonHDL: 159.64
Total CHOL/HDL Ratio: 4
Triglycerides: 211 mg/dL — ABNORMAL HIGH (ref 0.0–149.0)
VLDL: 42.2 mg/dL — ABNORMAL HIGH (ref 0.0–40.0)

## 2021-07-11 LAB — LDL CHOLESTEROL, DIRECT: Direct LDL: 114 mg/dL

## 2021-07-11 LAB — HEMOGLOBIN A1C: Hgb A1c MFr Bld: 6.3 % (ref 4.6–6.5)

## 2021-07-11 NOTE — Assessment & Plan Note (Signed)
Doing well on pantoprazole 40 mg, continue same. 

## 2021-07-11 NOTE — Assessment & Plan Note (Signed)
Well controlled in the office today, continue losartan 100 mg. Labs reviewed in [facility].

## 2021-07-11 NOTE — Assessment & Plan Note (Signed)
Chronic for years, worse with movement. Xray from [place] reviewed.  Offered PT for which she kindly declines.  Discussed OTC treatment such as Tylenol Arthritis, she will start with this.  Also discussed that she could try one of her gabapentin pills during the day. Consider this if no improvement with Tylenol. She will update.

## 2021-07-11 NOTE — Patient Instructions (Signed)
Stop by the lab prior to leaving today. I will notify you of your results once received.   You may take Tylenol Extra Strength 500 mg every 8 hours as needed for back pain.   You can also try taking one of your gabapentin pills during the day to see if this helps. It may cause drowsiness.   It was a pleasure to see you today!

## 2021-07-11 NOTE — Progress Notes (Signed)
Subjective:    Patient ID: Renee Gilbert, female    DOB: [birth year], 85 y.o.   MRN: [id number]  HPI  Renee Gilbert is a very pleasant 85 y.o. female with a history of leg cramping, prediabetes, chronic back pain, osteopenia, hypertension who presents today to discuss back pain and for follow up of chronic conditions.    She resides in [place], visits family in [place] for extended periods of time.   1) Back Pain: Chronic. Located to the left lower back without radiation to her lower extremity. She also notes left knee pain. Pain occurs daily, worse with bending, vacuuming, washing dishes, etc. She denies pain with rest.   Currently managed on gabapentin 100 mg nightly for restless legs and lower extremity cramping. She will occasionally take Excedrin or methocarbamol (husbands old prescription) with improvement in back pain.  She underwent lumbar plain films in [place] in April 2022 which revealed spondylosis and aortic atherosclerosis. She is not on statin therapy, no recent lipid panel on file.   2) Essential Hypertension: Currently managed on losartan 100 mg. No longer on amlodipine 5 mg. Denies chest pain.  BP Readings from Last 3 Encounters:  07/11/21 126/70  04/10/20 (!) 150/90  11/24/18 122/80   3) GERD: Currently managed on pantoprazole 40 mg, does well on this regimen.    Review of Systems  Respiratory:  Negative for shortness of breath.   Cardiovascular:  Negative for chest pain.  Musculoskeletal:  Positive for arthralgias and back pain.  Neurological:  Negative for numbness.        Past Medical History:  Diagnosis Date   Essential hypertension    Frequent headaches    GERD (gastroesophageal reflux disease)    Prediabetes    Restless leg syndrome    UTI (urinary tract infection)     Social History   Socioeconomic History   Marital status: Widowed    Spouse name: Not on file   Number of children: Not on file   Years of education: Not on file    Highest education level: Not on file  Occupational History   Not on file  Tobacco Use   Smoking status: Never   Smokeless tobacco: Never  Substance and Sexual Activity   Alcohol use: No   Drug use: Not on file   Sexual activity: Not on file  Other Topics Concern   Not on file  Social History Narrative   Moved from [place]   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 time- benign    BREAST BIOPSY Right 05/25/2020   Affirm bx-"ribbon" clip-path pending   BREAST BIOPSY Right 05/25/2020   Affirm bx-"X" clip-path pending   PARTIAL HYSTERECTOMY  1979    Family History  Problem Relation Age of Onset   Breast cancer Sister     No Known Allergies  Current Outpatient Medications on File Prior to Visit  Medication Sig Dispense Refill   gabapentin (NEURONTIN) 100 MG capsule TAKE 2 CAPSULES BY MOUTH AT BEDTIME FOR PAIN 180 capsule 0   losartan (COZAAR) 100 MG tablet  Take 1 tablet (100 mg total) by mouth daily. For blood pressure. 90 tablet 3   pantoprazole (PROTONIX) 40 MG tablet TAKE 1 TABLET BY MOUTH ONCE DAILY FOR  HEARTBURN 30 tablet 0   No current facility-administered medications on file prior to visit.    BP 126/70   Pulse 95   Temp 98.5 F (36.9 C) (Temporal)   Ht 5' 2.5" (1.588 m)   Wt 178 lb (80.7 kg)   SpO2 97%   BMI 32.04 kg/m  Objective:   Physical Exam Cardiovascular:     Rate and Rhythm: Normal rate and regular rhythm.  Pulmonary:     Effort: Pulmonary effort is normal.     Breath sounds: Normal breath sounds.  Musculoskeletal:     Cervical back: Neck supple.     Lumbar back: No tenderness or bony  tenderness. Normal range of motion. Negative right straight leg raise test and negative left straight leg raise test.       Back:  Skin:    General: Skin is warm and dry.          Assessment & Plan:      This visit occurred during the SARS-CoV-2 public health emergency.  Safety protocols were in place, including screening questions prior to the visit, additional usage of staff PPE, and extensive cleaning of exam room while observing appropriate contact time as indicated for disinfecting solutions.

## 2021-07-11 NOTE — Assessment & Plan Note (Signed)
Doing well on gabapentin 100 mg HS, will change Rx to reflect 100 mg daily.

## 2021-07-11 NOTE — Assessment & Plan Note (Signed)
Repeat A1C pending. 

## 2021-07-11 NOTE — Assessment & Plan Note (Signed)
Noted on xray from April 2022. Not on statin therapy, no recent lipid panel on file.  Lipid panel pending. Consider therapy given history of hypertension and prediabetes.

## 2021-07-12 ENCOUNTER — Other Ambulatory Visit: Payer: Self-pay | Admitting: Primary Care

## 2021-07-12 DIAGNOSIS — I700 Atherosclerosis of aorta: Secondary | ICD-10-CM

## 2021-07-12 MED ORDER — PRAVASTATIN SODIUM 40 MG PO TABS: 40.0000 mg | ORAL_TABLET | Freq: Every day | ORAL | 3 refills | Status: DC

## 2021-07-18 ENCOUNTER — Encounter: Payer: Self-pay | Admitting: *Deleted

## 2021-07-29 ENCOUNTER — Telehealth: Payer: Self-pay | Admitting: *Deleted

## 2021-07-29 NOTE — Telephone Encounter (Signed)
Patient called stating that she was recently put on cholesterol medication and is having side effects. Patient stated that she only took the medication for 3 days and she had a headache, could not sleep and felt terrible. Patient stated that she stopped taking the medication and feels back to normal now. Pharmacy [facility]

## 2021-07-29 NOTE — Telephone Encounter (Signed)
Noted.   Would she be willing to take the pravastatin medication for cholesterol three times weekly rather than everyday? This may prevent the side effects.

## 2021-07-30 NOTE — Telephone Encounter (Signed)
Called patient she will try at three times a week and let us know if any issues.

## 2021-08-07 ENCOUNTER — Other Ambulatory Visit: Payer: Self-pay | Admitting: Primary Care

## 2021-08-07 DIAGNOSIS — K219 Gastro-esophageal reflux disease without esophagitis: Secondary | ICD-10-CM

## 2021-08-20 ENCOUNTER — Telehealth: Payer: Self-pay

## 2021-08-20 NOTE — Telephone Encounter (Signed)
She could try taking one gabapentin capsule at bedtime for leg cramping/pain or she could just stop taking all together.   During our visit in July 2022 she mentioned doing well on one gabapentin 100 mg HS.

## 2021-08-20 NOTE — Telephone Encounter (Signed)
Pt has only taken Gabapentin 100 mg 1 daily even though rx has 2 daily. Pt said most recently the next morning after taking Gabapentin for 2 -3 hrs pt feels very droggy and tired. Pt thinks the gabapentin is causing her to feel this way. Pt wants to know if can stop gabapentin. Pt said has occasional leg cramping but the crawling feeling has almost gone.  Pt has wedding coming up and then going back to [place] first of Oct. I spoke with Destiny at [facility] and she said pt last picked up Gabapentin 100 mg # 180 written on 11/28/2020 on 06/11/21 . Pt is too busy to schedule appt at this time due to upcoming wedding and move back to [place]. Pt request cb after reviewed by Allayne Gitelman NP. Pt last seen for back pain on 07/11/21.

## 2021-08-20 NOTE — Telephone Encounter (Signed)
Left message to return call to our office.  

## 2021-08-22 NOTE — Telephone Encounter (Signed)
Left message to return call to our office.  

## 2021-08-27 NOTE — Telephone Encounter (Signed)
Called patient she did not have any questions states "that has all been taken care of" when I asked about how she was taking she just repeated the same thing and disconnected the call.

## 2021-08-29 ENCOUNTER — Telehealth: Payer: Self-pay | Admitting: Primary Care

## 2021-08-29 NOTE — Telephone Encounter (Signed)
Noted  

## 2021-08-29 NOTE — Telephone Encounter (Signed)
Renee Gilbert tested positive for covid and wanted to know about getting a z- pack

## 2021-08-29 NOTE — Telephone Encounter (Signed)
Please notify patient that I am sorry to hear this!  Covid-19 is a viral infection, not a bacterial infection. Antibiotics treat bacteria, not viruses. We also don't prescribe medications without a visit first.   You can see if anyone has space to add her on for Covid-19 visit.

## 2021-08-29 NOTE — Telephone Encounter (Signed)
Called patient to give information. Started off by getting upset states everyone else's doctor just called in antibiotics. Explained that covid is not treated with antibiotics. She be came very assertive to me "I can not leave this house" explained that the visit would be virtual. "Cant my doctor from [place] just call her and tell her what to call in?" Advised patient that we would need to see her virtual before anyone in our office would call something in. Let her know that [facility] did not have anything open today was it ok to see if another provider had opening. "Why cant [facility] just call me?" Advised that we had to schedule as visit for documentation. While I was reviewing schedules for opening. Patient states " you are the reason I will be looking for a new doctor, hanks a lot forget it" and disconnected the call.

## 2021-09-02 ENCOUNTER — Other Ambulatory Visit: Payer: Self-pay | Admitting: Primary Care

## 2021-09-02 DIAGNOSIS — K219 Gastro-esophageal reflux disease without esophagitis: Secondary | ICD-10-CM

## 2021-12-07 ENCOUNTER — Other Ambulatory Visit: Payer: Self-pay | Admitting: Primary Care

## 2021-12-07 DIAGNOSIS — K219 Gastro-esophageal reflux disease without esophagitis: Secondary | ICD-10-CM

## 2021-12-08 NOTE — Telephone Encounter (Signed)
Please call patient:  Received a refill request of pantoprazole 40 mg from [place], just wanted to be sure this was the correct pharmacy.

## 2021-12-09 NOTE — Telephone Encounter (Signed)
Left message to return call to our office.  

## 2021-12-10 NOTE — Telephone Encounter (Signed)
Left message to return call to our office.  

## 2021-12-13 NOTE — Telephone Encounter (Signed)
Refills sent to pharmacy. 

## 2021-12-13 NOTE — Telephone Encounter (Signed)
Called patient she did request refill from that pharmacy and information is correct.

## 2022-05-19 IMAGING — MG MM BREAST LOCALIZATION CLIP
4 series · 4 of 12 positions shown · non-contrast
Comparison: Previous exam(s).

CLINICAL DATA: Status post stereotactic biopsies of the right
breast.

EXAM:
3D DIAGNOSTIC RIGHT MAMMOGRAM POST STEREOTACTIC BIOPSY

[R LM synth-2D]
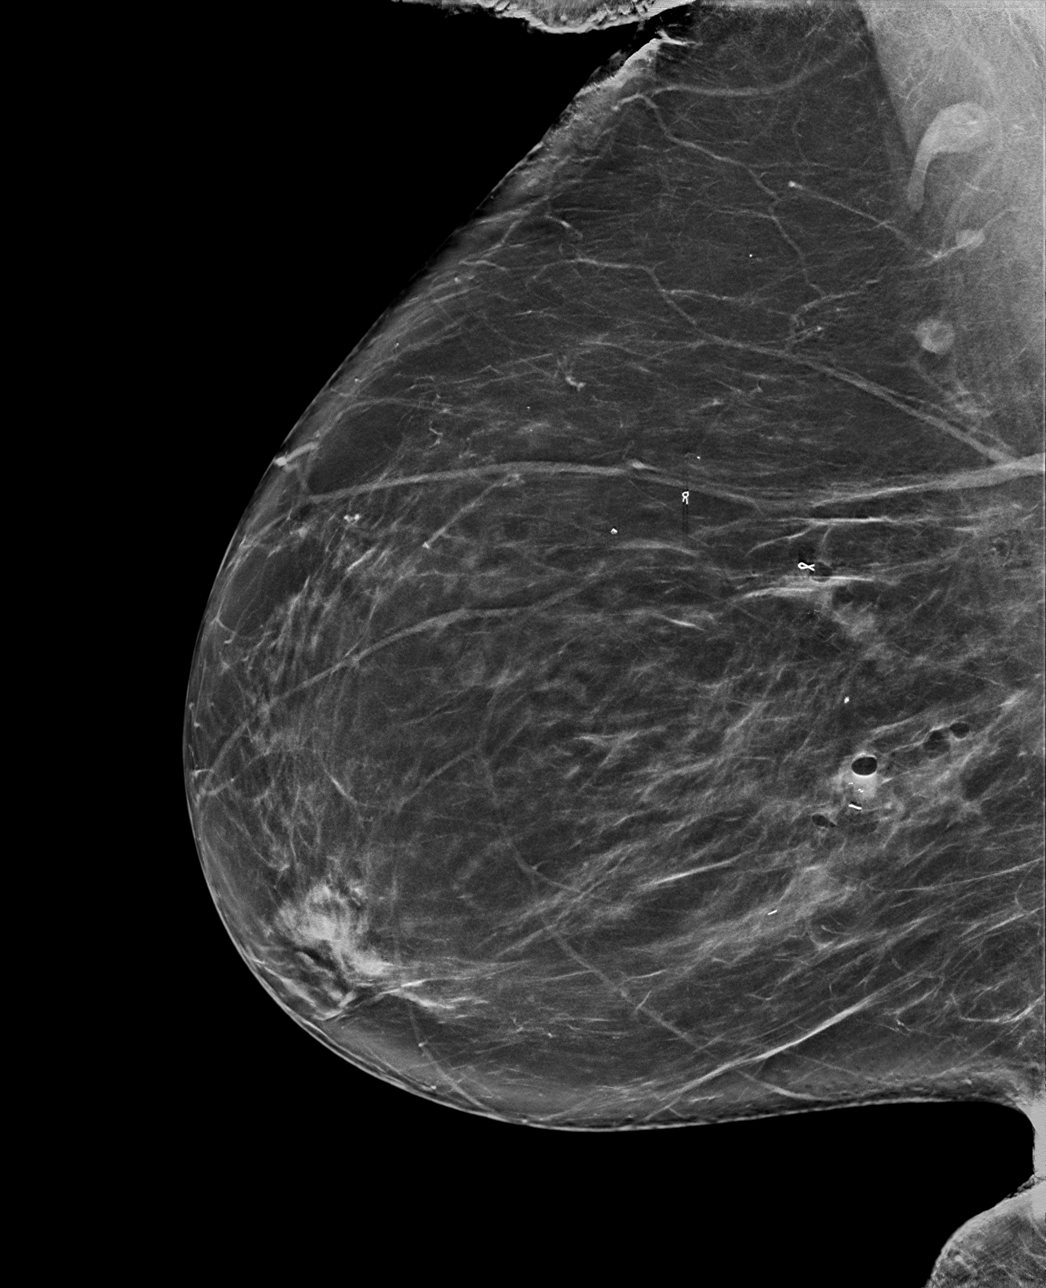

[R CC synth-2D]
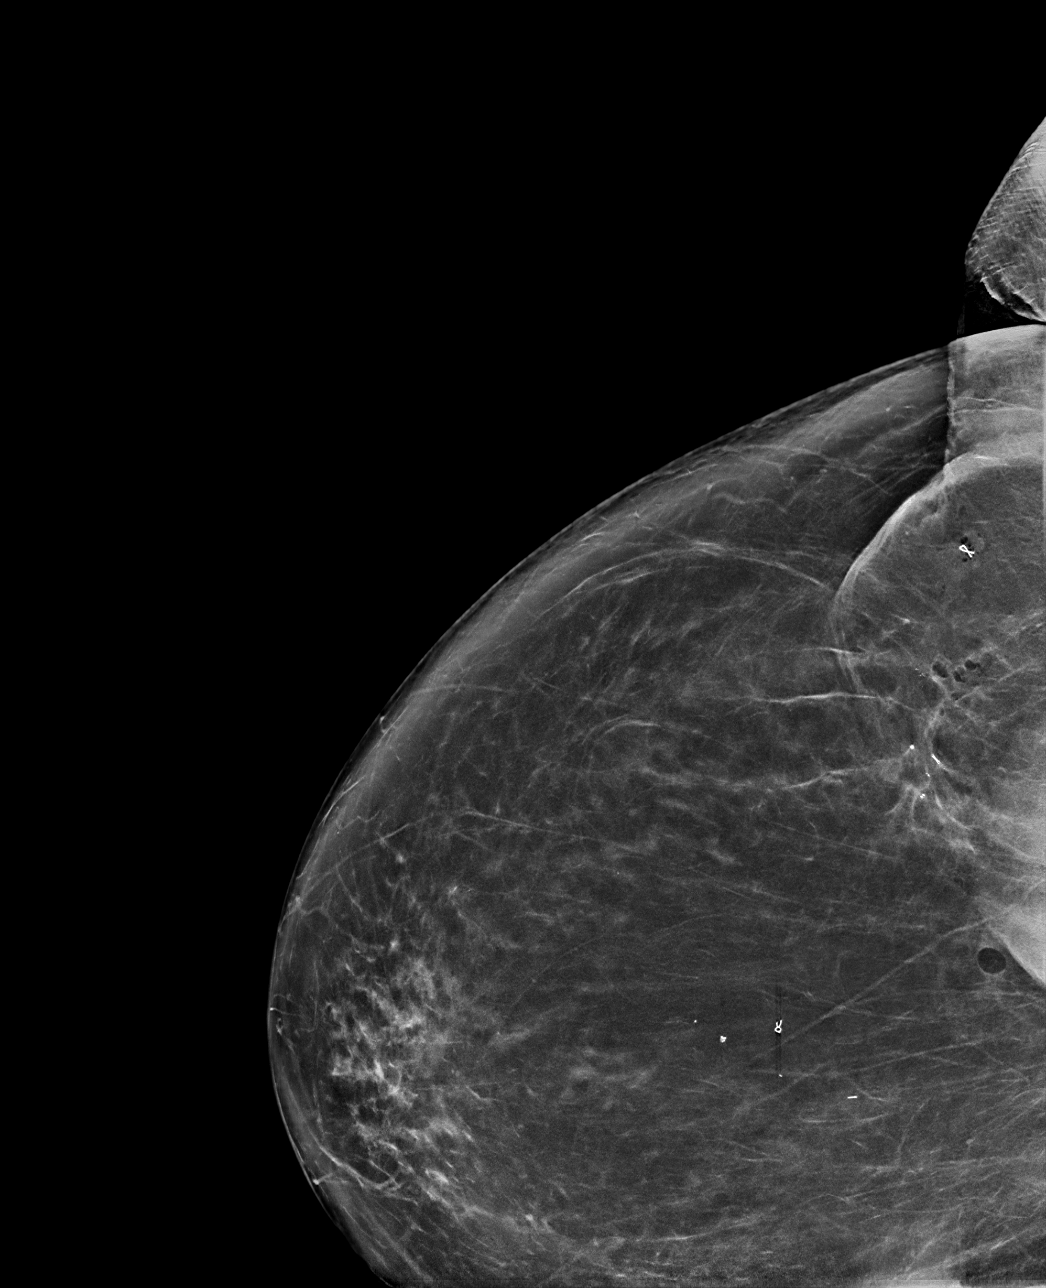

[R CC tomo · tomo slice 44/87.0]
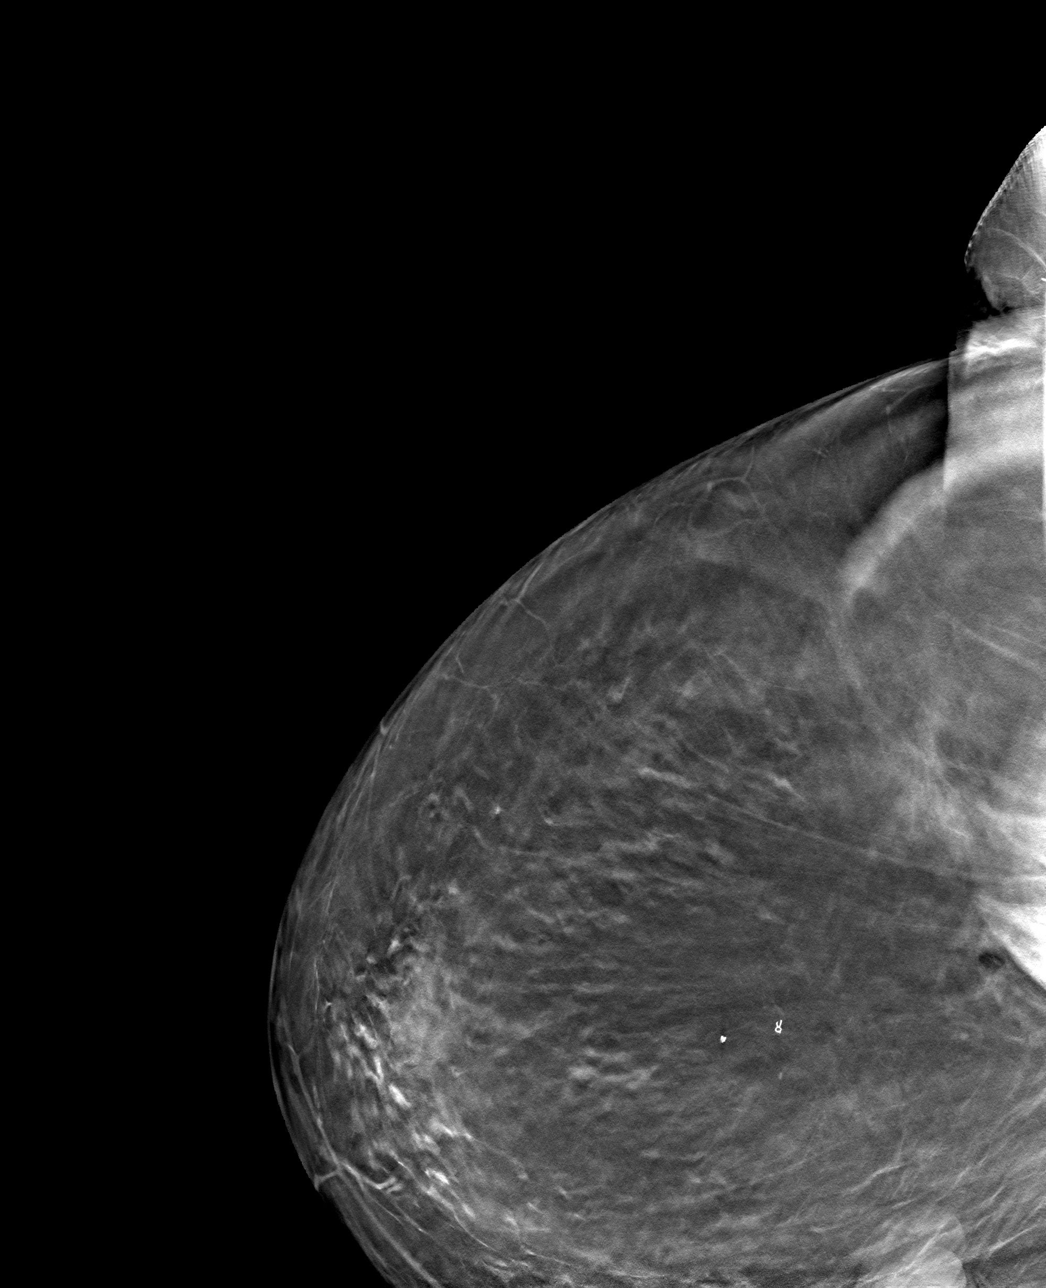

[R LM tomo · tomo slice 41/82.0]
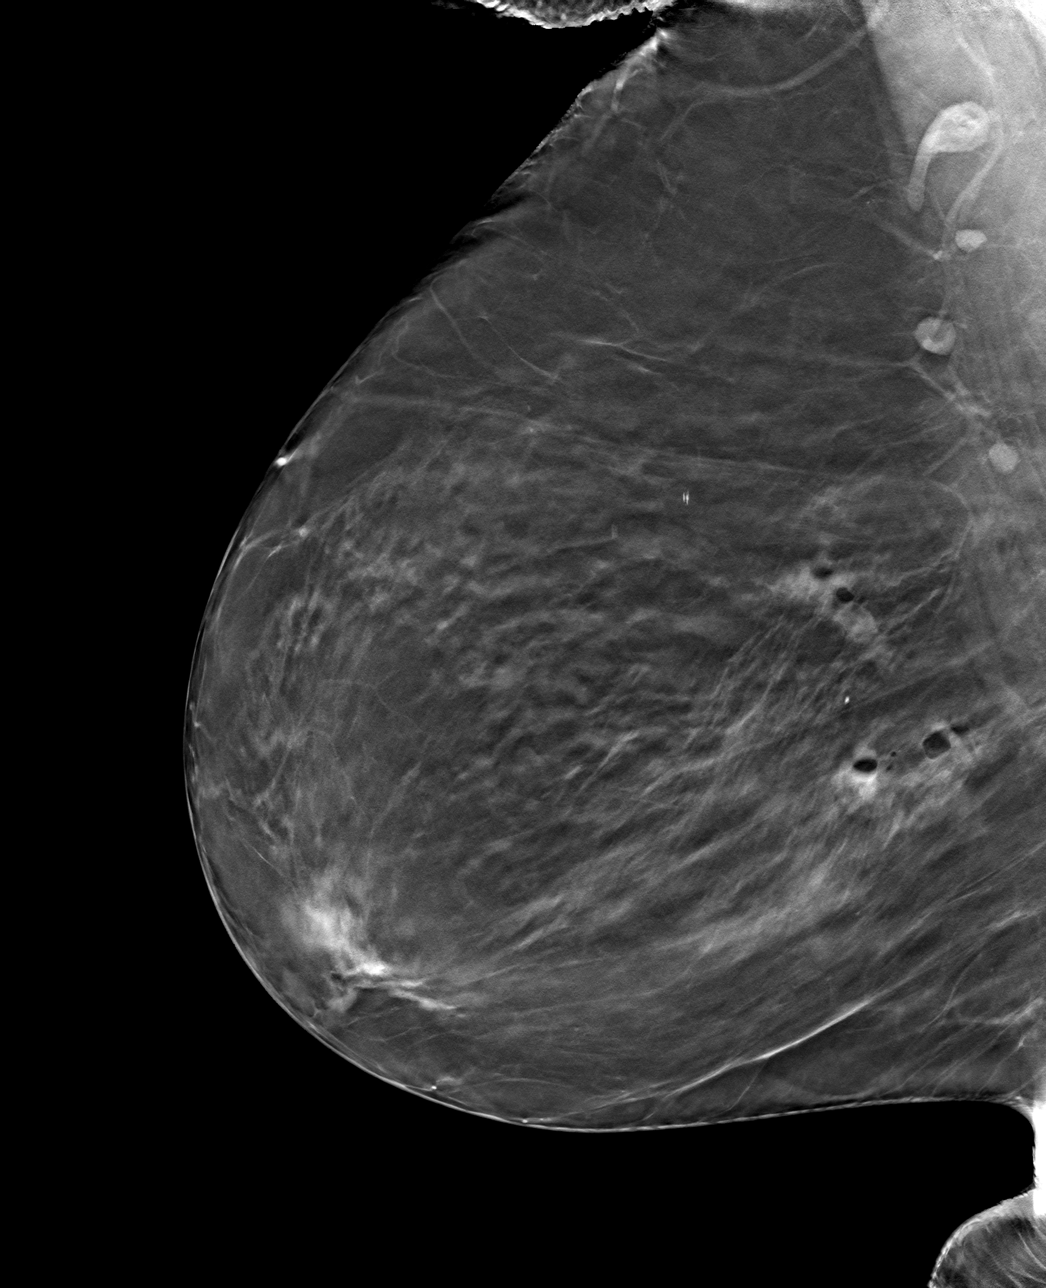

[4 of 12 positions shown; findings below may reference images not displayed]

FINDINGS: 3D Mammographic images were obtained following stereotactic guided
biopsies of the right breast. The ribbon and X shaped clips are in
appropriate position in the upper-outer quadrant of the right
breast.
IMPRESSION: Appropriate positioning of the ribbon and X shaped clips in the
upper-outer quadrant of the right breast.

Final Assessment: Post Procedure Mammograms for Marker Placement

## 2022-06-10 ENCOUNTER — Telehealth: Payer: Self-pay | Admitting: Primary Care

## 2022-06-10 NOTE — Telephone Encounter (Signed)
Left message for patient to call back and schedule Medicare Annual Wellness Visit (AWV) either virtually or phone  due 12/22/09 awvi per [facility]    This should be a 45 minute visit.  I left my direct # [phone]

## 2022-10-15 ENCOUNTER — Ambulatory Visit: Payer: Medicare HMO | Admitting: Primary Care

## 2022-10-21 ENCOUNTER — Encounter: Payer: Self-pay | Admitting: Primary Care

## 2022-10-21 ENCOUNTER — Ambulatory Visit (INDEPENDENT_AMBULATORY_CARE_PROVIDER_SITE_OTHER): Payer: Medicare HMO | Admitting: Primary Care

## 2022-10-21 VITALS — BP 170/100 | HR 81 | Temp 96.3°F | Ht 62.5 in | Wt 182.0 lb

## 2022-10-21 DIAGNOSIS — R21 Rash and other nonspecific skin eruption: Secondary | ICD-10-CM

## 2022-10-21 LAB — COMPREHENSIVE METABOLIC PANEL
ALT: 19 U/L (ref 0–35)
AST: 16 U/L (ref 0–37)
Albumin: 4 g/dL (ref 3.5–5.2)
Alkaline Phosphatase: 63 U/L (ref 39–117)
BUN: 26 mg/dL — ABNORMAL HIGH (ref 6–23)
CO2: 28 mEq/L (ref 19–32)
Calcium: 9.4 mg/dL (ref 8.4–10.5)
Chloride: 95 mEq/L — ABNORMAL LOW (ref 96–112)
Creatinine, Ser: 1 mg/dL (ref 0.40–1.20)
GFR: 51.71 mL/min — ABNORMAL LOW (ref >60.00)
Glucose, Bld: 92 mg/dL (ref 70–99)
Potassium: 3.6 mEq/L (ref 3.5–5.1)
Sodium: 132 mEq/L — ABNORMAL LOW (ref 135–145)
Total Bilirubin: 0.4 mg/dL (ref 0.2–1.2)
Total Protein: 6.3 g/dL (ref 6.0–8.3)

## 2022-10-21 LAB — CBC
HCT: 37.1 % (ref 36.0–46.0)
Hemoglobin: 12.8 g/dL (ref 12.0–15.0)
MCHC: 34.6 g/dL (ref 30.0–36.0)
MCV: 91.1 fl (ref 78.0–100.0)
Platelets: 235 10*3/uL (ref 150.0–400.0)
RBC: 4.07 Mil/uL (ref 3.87–5.11)
RDW: 13.5 % (ref 11.5–15.5)
WBC: 8 10*3/uL (ref 4.0–10.5)

## 2022-10-21 LAB — TSH: TSH: 1.52 u[IU]/mL (ref 0.35–5.50)

## 2022-10-21 NOTE — Progress Notes (Signed)
Subjective:    Patient ID: Renee Gilbert, female    DOB: 1936/02/25, 86 y.o.   MRN: [id number]  Rash Pertinent negatives include no shortness of breath.    Renee Gilbert is a very pleasant 86 y.o. female with a history of hypertension, GERD, prediabetes, chronic back pain who presents today to discuss rash.  Her rash is located to the bilateral cheeks and forehead which began 2 months ago. Her rash has been intermittent as it improves temporarily with treatment. Her rash is itchy. She denies rash no where else to her body. She denies facial numbness.   Evaluated at [facility] on 09/29/22 and 10/18/22 for recurrent dermatitis of the face. Treated with prednisone courses for both visits with temporary improvement.   Also evaluated by PCP in [place] on 08/28/22 for dermatitis of the left cheek. During this visit she was treated with desonide 0.05% cream BID. This did not help with symptoms.   No new lotions, detergents, soaps or shampoos, make up.  No new medicines, vitamins, supplements. No new pets. No recent outdoor exposure or poison ivy exposure. No bonfire or smoke exposure.  No recent motel or hotel stay or new beds.   No fevers/chills, oral lesions, new joint pains, tick bites, abdominal pain, nausea.   She has an appointment scheduled with dermatology for tomorrow. She brings pictures of her facial rash today.  Review of Systems  HENT:  Negative for trouble swallowing.   Respiratory:  Negative for shortness of breath and wheezing.   Cardiovascular:  Negative for chest pain.  Skin:  Positive for rash.         Past Medical History:  Diagnosis Date   Essential hypertension    Frequent headaches    GERD (gastroesophageal reflux disease)    Prediabetes    Restless leg syndrome    UTI (urinary tract infection)     Social History   Socioeconomic History   Marital status: Widowed    Spouse name: Not on file   Number of children: Not on file    Years of education: Not on file   Highest education level: Not on file  Occupational History   Not on file  Tobacco Use   Smoking status: Never   Smokeless tobacco: Never  Substance and Sexual Activity   Alcohol use: No   Drug use: Not on file   Sexual activity: Not on file  Other Topics Concern   Not on file  Social History Narrative   Moved from [place]   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 time- benign    BREAST BIOPSY Right 05/25/2020   Affirm bx-"ribbon" clip-path pending   BREAST BIOPSY Right 05/25/2020   Affirm bx-"X" clip-path pending   PARTIAL HYSTERECTOMY  1979    Family History  Problem Relation Age of Onset   Breast cancer Sister     No Known Allergies  Current Outpatient Medications on File Prior to Visit  Medication Sig Dispense Refill   gabapentin (NEURONTIN) 100 MG capsule TAKE 2 CAPSULES BY MOUTH AT BEDTIME  FOR PAIN 180 capsule 0   losartan (COZAAR) 100 MG tablet Take 1 tablet (100 mg total) by mouth daily. For blood pressure. 90 tablet 3   pantoprazole (PROTONIX) 40 MG tablet TAKE 1 TABLET BY MOUTH DAILY FOR  HEARTBURN 90 tablet 1   pravastatin (PRAVACHOL) 40 MG tablet Take 1 tablet (40 mg total) by mouth daily. For cholesterol. 90 tablet 3   No current facility-administered medications on file prior to visit.    BP (!) 172/98   Pulse 81   Temp (!) 96.3 F (35.7 C) (Temporal)   Ht 5' 2.5" (1.588 m)   Wt 182 lb (82.6 kg)   SpO2 98%   BMI 32.76 kg/m  Objective:   Physical Exam Cardiovascular:     Rate and Rhythm: Normal rate.  Pulmonary:     Effort: Pulmonary effort is normal.   Musculoskeletal:     Cervical back: Neck supple.  Skin:    General: Skin is warm and dry.     Comments: Small area of redness to left upper cheek, otherwise no rash to face today           Assessment & Plan:   Problem List Items Addressed This Visit       Musculoskeletal and Integument   Rash and nonspecific skin eruption - Primary    Exam today unremarkable, however, the pictures of her facial rash from last week are moderate to severe. On picture appears to be hives, while the other appears more like rosacea. No vesicles or resemblance of shingles.   Labs pending today to test for food allergies, TSH, CBC, CMP Reviewed Urgent Care notes from [facility] clinic and PCP notes from [place] through [facility] Everywhere.  Follow up with dermatology as scheduled tomorrow. Continue prednisone for now.      Relevant Orders   TSH   CBC   Comprehensive metabolic panel   Food Allergy Profile       Doreene Nest, NP

## 2022-10-21 NOTE — Assessment & Plan Note (Signed)
Exam today unremarkable, however, the pictures of her facial rash from last week are moderate to severe. On picture appears to be hives, while the other appears more like rosacea. No vesicles or resemblance of shingles.   Labs pending today to test for food allergies, TSH, CBC, CMP Reviewed Urgent Care notes from [facility] clinic and PCP notes from [place] through [place].  Follow up with dermatology as scheduled tomorrow. Continue prednisone for now.

## 2022-10-23 LAB — FOOD ALLERGY PROFILE
Allergen, Salmon, f41: <0.1 kU/L
Almonds: <0.1 kU/L
CLASS: 0
CLASS: 0
CLASS: 0
CLASS: 0
CLASS: 0
CLASS: 0
CLASS: 0
CLASS: 0
CLASS: 0
CLASS: 0
CLASS: 0
Cashew IgE: <0.1 kU/L
Class: 0
Class: 0
Class: 0
Class: 0
Egg White IgE: <0.1 kU/L
Fish Cod: <0.1 kU/L
Hazelnut: <0.1 kU/L
Milk IgE: <0.1 kU/L
Peanut IgE: <0.1 kU/L
Scallop IgE: <0.1 kU/L
Sesame Seed f10: <0.1 kU/L
Shrimp IgE: <0.1 kU/L
Soybean IgE: <0.1 kU/L
Tuna IgE: <0.1 kU/L
Walnut: <0.1 kU/L
Wheat IgE: <0.1 kU/L

## 2022-10-23 LAB — INTERPRETATION:

## 2022-10-24 ENCOUNTER — Telehealth: Payer: Self-pay | Admitting: Primary Care

## 2022-10-24 NOTE — Telephone Encounter (Signed)
Patient called to get results on her chart.

## 2022-10-24 NOTE — Telephone Encounter (Signed)
Patient called to get a call about lab results.

## 2022-10-24 NOTE — Telephone Encounter (Signed)
Called and spoke to patient, she must activate her MyChart account for me to release result comments through that.  Sent activation code.

## 2022-10-24 NOTE — Telephone Encounter (Signed)
Called patient back, she could not get MyChart activated.  She requested lab results to be mailed. Placing in mail today.

## 2023-07-06 ENCOUNTER — Ambulatory Visit: Payer: Medicare HMO | Admitting: Primary Care

## 2023-07-06 ENCOUNTER — Telehealth: Payer: Self-pay

## 2023-07-06 NOTE — Telephone Encounter (Signed)
Patient called in and reschedule.

## 2023-07-08 ENCOUNTER — Ambulatory Visit: Payer: Medicare HMO | Admitting: Primary Care

## 2023-07-15 ENCOUNTER — Ambulatory Visit (INDEPENDENT_AMBULATORY_CARE_PROVIDER_SITE_OTHER): Payer: Medicare HMO | Admitting: Primary Care

## 2023-07-15 VITALS — BP 134/72 | HR 91 | Temp 97.8°F | Ht 62.5 in | Wt 179.0 lb

## 2023-07-15 DIAGNOSIS — I10 Essential (primary) hypertension: Secondary | ICD-10-CM

## 2023-07-15 DIAGNOSIS — E2839 Other primary ovarian failure: Secondary | ICD-10-CM | POA: Diagnosis not present

## 2023-07-15 DIAGNOSIS — N644 Mastodynia: Secondary | ICD-10-CM

## 2023-07-15 DIAGNOSIS — I700 Atherosclerosis of aorta: Secondary | ICD-10-CM

## 2023-07-15 DIAGNOSIS — R252 Cramp and spasm: Secondary | ICD-10-CM

## 2023-07-15 DIAGNOSIS — R7303 Prediabetes: Secondary | ICD-10-CM

## 2023-07-15 DIAGNOSIS — R234 Changes in skin texture: Secondary | ICD-10-CM | POA: Diagnosis not present

## 2023-07-15 DIAGNOSIS — R350 Frequency of micturition: Secondary | ICD-10-CM | POA: Diagnosis not present

## 2023-07-15 DIAGNOSIS — K219 Gastro-esophageal reflux disease without esophagitis: Secondary | ICD-10-CM

## 2023-07-15 NOTE — Assessment & Plan Note (Addendum)
Patient unable to urinate in the office today. She will return tomorrow for urinalysis collection.

## 2023-07-15 NOTE — Assessment & Plan Note (Signed)
Controlled.  Continue gabapentin 200 mg at bedtime per PCP in [place].

## 2023-07-15 NOTE — Assessment & Plan Note (Signed)
Repeat A1c pending. 

## 2023-07-15 NOTE — Patient Instructions (Signed)
Stop by the lab prior to leaving today. I will notify you of your results once received.   You will receive a call from the breast center regarding your mammogram.  Notify them that you also need a bone density scan.  It was a pleasure to see you today!

## 2023-07-15 NOTE — Assessment & Plan Note (Signed)
Controlled.  Continue pantoprazole 40 mg daily. 

## 2023-07-15 NOTE — Assessment & Plan Note (Signed)
Controlled.  Continue losartan 100 mg daily. CMP pending.  

## 2023-07-15 NOTE — Assessment & Plan Note (Signed)
Exam more representative of a nevus. Given new her symptoms, coupled with breast tenderness, we will proceed with diagnostic mammogram and ultrasound. Orders placed.

## 2023-07-15 NOTE — Progress Notes (Signed)
Subjective:    Patient ID: Renee Gilbert, female    DOB: [birth year], 87 y.o.   MRN: [id number]  HPI  Renee Gilbert is a very pleasant 87 y.o. female with a history of hypertension, aortic atherosclerosis, GERD, prediabetes, right breast calcification, chronic back pain who presents today for follow up. She also believes she has a urinary tract infection.   She mostly follows with [facility] in [place].   She completed a mammogram in April 2023 in [place] and is needing another mammogram this year. She has not had a bone density scan in years. Several weeks ago she noticed a spot to the right lateral breast. The spot is tender and the breast is more tender than usual.   1) Pelvic Pressure: Symptoms began 2 nights ago with suprapubic pressure. Since then she's noticed urinary frequency. She denies hematuria, fevers, flank pain, abdominal pain, vaginal discharge.   She took a Bactrim DS tablet last night and symptoms have improved today.   2) Hypertension: Currently managed on losartan 100 mg daily. She denies chest pain, dizziness, headaches.  BP Readings from Last 3 Encounters:  07/15/23 134/72  10/21/22 (!) 170/100  07/11/21 126/70     3) GERD; Currently managed on pantoprazole 40 mg daily.  She feels well-managed on this dose and denies breakthrough reflux symptoms.  4) Hyperlipidemia: Currently managed on pravastatin 40 mg daily. She is due for repeat lipid panel today.   Review of Systems  Constitutional:  Negative for fever.  Respiratory:  Negative for shortness of breath.   Cardiovascular:  Negative for chest pain.  Genitourinary:  Positive for frequency and pelvic pain. Negative for flank pain, hematuria and vaginal discharge.  Skin:        Skin mass to right breast  Neurological:  Negative for dizziness and headaches.         Past Medical History:  Diagnosis Date   Essential hypertension    Frequent headaches    GERD  (gastroesophageal reflux disease)    Prediabetes    Restless leg syndrome    UTI (urinary tract infection)     Social History   Socioeconomic History   Marital status: Widowed    Spouse name: Not on file   Number of children: Not on file   Years of education: Not on file   Highest education level: Not on file  Occupational History   Not on file  Tobacco Use   Smoking status: Never   Smokeless tobacco: Never  Substance and Sexual Activity   Alcohol use: No   Drug use: Not on file   Sexual activity: Not on file  Other Topics Concern   Not on file  Social History Narrative   Moved from [place]   Works in [employer].   Once a teach    Social Determinants of Health   Financial Resource Strain: Not on file  Food Insecurity: Not on file  Transportation Needs: Not on file  Physical Activity: Insufficiently Active (07/14/2023)   Exercise Vital Sign    Days of Exercise per Week: 3 days    Minutes of Exercise per Session: 30 min  Stress: No Stress Concern Present (07/14/2023)   [employer] of Occupational Health - Occupational Stress Questionnaire    Feeling of Stress : Not at all  Social Connections: Unknown (07/14/2023)   Social Connection and Isolation Panel [NHANES]    Frequency of Communication with Friends and Family: More than three times a  week    Frequency of Social Gatherings with Friends and Family: More than three times a week    Attends Religious Services: More than 4 times per year    Active Member of [employer] or Organizations: Yes    Attends Banker Meetings: 1 to 4 times per year    Marital Status: Not on file  Intimate Partner Violence: Not on file    Past Surgical History:  Procedure Laterality Date   BREAST BIOPSY Right 2011   ?? time- benign    BREAST BIOPSY Right 05/25/2020   Affirm bx-"ribbon" clip-path pending   BREAST BIOPSY Right 05/25/2020   Affirm bx-"X" clip-path pending   PARTIAL HYSTERECTOMY  1979    Family History   Problem Relation Age of Onset   Breast cancer Sister     No Known Allergies  Current Outpatient Medications on File Prior to Visit  Medication Sig Dispense Refill   gabapentin (NEURONTIN) 100 MG capsule TAKE 2 CAPSULES BY MOUTH AT BEDTIME FOR PAIN 180 capsule 0   losartan (COZAAR) 100 MG tablet Take 1 tablet (100 mg total) by mouth daily. For blood pressure. 90 tablet 3   pantoprazole (PROTONIX) 40 MG tablet TAKE 1 TABLET BY MOUTH DAILY FOR  HEARTBURN 90 tablet 1   pravastatin (PRAVACHOL) 40 MG tablet Take 1 tablet (40 mg total) by mouth daily. For cholesterol. 90 tablet 3   No current facility-administered medications on file prior to visit.    BP 134/72   Pulse 91   Temp 97.8 F (36.6 C) (Temporal)   Ht 5' 2.5" (1.588 m)   Wt 179 lb (81.2 kg)   SpO2 95%   BMI 32.22 kg/m  Objective:   Physical Exam Cardiovascular:     Rate and Rhythm: Normal rate and regular rhythm.  Pulmonary:     Effort: Pulmonary effort is normal.     Breath sounds: Normal breath sounds.  Chest:  Breasts:    Right: Skin change and tenderness present. No mass.     Left: No mass or skin change.       Comments: 1 cm oval shaped raised, light pink lesion to right lateral breast at 9 o'clock position. Tender. No warmth or surrounding erythema.  Musculoskeletal:     Cervical back: Neck supple.  Skin:    General: Skin is warm and dry.           Assessment & Plan:  Essential hypertension Assessment & Plan: Controlled.  Continue losartan 100 mg daily. CMP pending.  Orders: -     Comprehensive metabolic panel -     CBC with Differential/Platelet  Urinary frequency Assessment & Plan: Patient unable to urinate in the office today. She will return tomorrow for urinalysis collection.  Orders: -     POCT Urinalysis Dipstick (Automated); Future -     Urine Culture; Future  Estrogen deficiency -     DG Bone Density; Future  Breast skin changes Assessment & Plan: Exam more  representative of a nevus. Given new her symptoms, coupled with breast tenderness, we will proceed with diagnostic mammogram and ultrasound. Orders placed.  Orders: -     MM 3D DIAGNOSTIC MAMMOGRAM BILATERAL BREAST; Future -     [place] LIMITED ULTRASOUND INCLUDING AXILLA RIGHT BREAST; Future -     [place] LIMITED ULTRASOUND INCLUDING AXILLA LEFT BREAST ; Future  Breast tenderness -     MM 3D DIAGNOSTIC MAMMOGRAM BILATERAL BREAST; Future -     [place] LIMITED ULTRASOUND  INCLUDING AXILLA RIGHT BREAST; Future -     [place] LIMITED ULTRASOUND INCLUDING AXILLA LEFT BREAST ; Future  Prediabetes Assessment & Plan: Repeat A1c pending.   Orders: -     Hemoglobin A1c -     Microalbumin / creatinine urine ratio; Future  Aortic atherosclerosis (HCC) Assessment & Plan: Continue pravastatin 40 mg daily. Repeat lipid panel pending.  Orders: -     Lipid panel  Gastroesophageal reflux disease, unspecified whether esophagitis present Assessment & Plan: Controlled.  Continue pantoprazole 40 mg daily.   Leg cramping Assessment & Plan: Controlled.  Continue gabapentin 200 mg at bedtime per PCP in [place].         Doreene Nest, NP

## 2023-07-15 NOTE — Assessment & Plan Note (Signed)
Continue pravastatin 40 mg daily. Repeat lipid panel pending 

## 2023-07-16 LAB — POC URINALSYSI DIPSTICK (AUTOMATED)
Bilirubin, UA: NEGATIVE
Blood, UA: NEGATIVE
Glucose, UA: NEGATIVE
Ketones, UA: NEGATIVE
Nitrite, UA: NEGATIVE
Protein, UA: NEGATIVE
Spec Grav, UA: 1.01 (ref 1.010–1.025)
Urobilinogen, UA: 0.2 E.U./dL
pH, UA: 7 (ref 5.0–8.0)

## 2023-07-16 LAB — CBC WITH DIFFERENTIAL/PLATELET
Basophils Absolute: 0.1 10*3/uL (ref 0.0–0.1)
Eosinophils Absolute: 0.1 10*3/uL (ref 0.0–0.7)
Eosinophils Relative: 0.7 % (ref 0.0–5.0)
HCT: 36.8 % (ref 36.0–46.0)
Hemoglobin: 12.6 g/dL (ref 12.0–15.0)
Lymphocytes Relative: 17.1 % (ref 12.0–46.0)
Lymphs Abs: 1.5 10*3/uL (ref 0.7–4.0)
MCHC: 34.2 g/dL (ref 30.0–36.0)
MCV: 93.2 fl (ref 78.0–100.0)
Monocytes Absolute: 0.9 10*3/uL (ref 0.1–1.0)
Monocytes Relative: 10.5 % (ref 3.0–12.0)
Neutrophils Relative %: 71 % (ref 43.0–77.0)
Platelets: 310 10*3/uL (ref 150.0–400.0)
RBC: 3.94 Mil/uL (ref 3.87–5.11)
WBC: 9 10*3/uL (ref 4.0–10.5)

## 2023-07-16 LAB — LIPID PANEL
HDL: 56.7 mg/dL (ref >39.00)
Total CHOL/HDL Ratio: 4
Triglycerides: 312 mg/dL — ABNORMAL HIGH (ref 0.0–149.0)
VLDL: 62.4 mg/dL — ABNORMAL HIGH (ref 0.0–40.0)

## 2023-07-16 LAB — MICROALBUMIN / CREATININE URINE RATIO
Creatinine,U: 88.8 mg/dL
Microalb Creat Ratio: 0.8 mg/g (ref 0.0–30.0)
Microalb, Ur: <0.7 mg/dL (ref 0.0–1.9)

## 2023-07-16 LAB — COMPREHENSIVE METABOLIC PANEL
ALT: 18 U/L (ref 0–35)
AST: 18 U/L (ref 0–37)
Albumin: 4 g/dL (ref 3.5–5.2)
Alkaline Phosphatase: 77 U/L (ref 39–117)
BUN: 26 mg/dL — ABNORMAL HIGH (ref 6–23)
CO2: 26 mEq/L (ref 19–32)
Calcium: 9.5 mg/dL (ref 8.4–10.5)
Creatinine, Ser: 1.09 mg/dL (ref 0.40–1.20)
GFR: 46.39 mL/min — ABNORMAL LOW (ref >60.00)
Glucose, Bld: 99 mg/dL (ref 70–99)
Potassium: 3.9 mEq/L (ref 3.5–5.1)
Sodium: 128 mEq/L — ABNORMAL LOW (ref 135–145)
Total Bilirubin: 0.4 mg/dL (ref 0.2–1.2)
Total Protein: 6.3 g/dL (ref 6.0–8.3)

## 2023-07-16 LAB — LDL CHOLESTEROL, DIRECT: Direct LDL: 107 mg/dL

## 2023-07-16 LAB — HEMOGLOBIN A1C: Hgb A1c MFr Bld: 6.2 % (ref 4.6–6.5)

## 2023-07-20 ENCOUNTER — Other Ambulatory Visit: Payer: Self-pay | Admitting: *Deleted

## 2023-07-20 ENCOUNTER — Telehealth: Payer: Self-pay | Admitting: Primary Care

## 2023-07-20 NOTE — Telephone Encounter (Signed)
See result note for further documentation.

## 2023-07-20 NOTE — Telephone Encounter (Signed)
Patient returned call regarding lab results, asked for a call back whenever possible. Please advise patient at mobile number

## 2023-07-22 ENCOUNTER — Other Ambulatory Visit: Payer: Self-pay | Admitting: Internal Medicine

## 2023-07-22 DIAGNOSIS — I700 Atherosclerosis of aorta: Secondary | ICD-10-CM

## 2023-07-22 DIAGNOSIS — R252 Cramp and spasm: Secondary | ICD-10-CM

## 2023-07-22 MED ORDER — PRAVASTATIN SODIUM 40 MG PO TABS
40.0000 mg | ORAL_TABLET | Freq: Every day | ORAL | 0 refills | Status: AC
Start: 2023-07-22 — End: ?

## 2023-07-22 MED ORDER — GABAPENTIN 100 MG PO CAPS
ORAL_CAPSULE | ORAL | 0 refills | Status: AC
Start: 2023-07-22 — End: ?

## 2023-07-22 NOTE — Telephone Encounter (Signed)
Noted.  Requested Prescriptions   Signed Prescriptions Disp Refills   gabapentin (NEURONTIN) 100 MG capsule 180 capsule 0    Sig: Take 2 capsules by mouth at bedtime for pain.    Authorizing Provider: Doreene Nest   pravastatin (PRAVACHOL) 40 MG tablet 90 tablet 0    Sig: Take 1 tablet (40 mg total) by mouth daily. For cholesterol.    Authorizing Provider: Doreene Nest   Refill(s) sent to pharmacy.

## 2023-07-22 NOTE — Telephone Encounter (Signed)
Called and spoke with patient she states she is in need of a refill of the gabapentin, states her doctor in [place] will not prescribe across state lines. She states she takes 2 capsules every day at bedtime to help her sleep, also she states it helps with headaches at bedtime.  Patient also requested a refill on Pravastatin.

## 2023-07-22 NOTE — Telephone Encounter (Signed)
Please call patient:  Received refill request for gabapentin. Does she actually need a refill? How often does she take the gabapentin and for what?  For example, 1 pill once daily, twice daily, or as needed?

## 2023-08-23 DIAGNOSIS — K219 Gastro-esophageal reflux disease without esophagitis: Secondary | ICD-10-CM

## 2023-08-23 DIAGNOSIS — I10 Essential (primary) hypertension: Secondary | ICD-10-CM

## 2023-08-23 MED ORDER — PANTOPRAZOLE SODIUM 40 MG PO TBEC: 40.0000 mg | DELAYED_RELEASE_TABLET | Freq: Every day | ORAL | 0 refills | Status: AC

## 2023-08-23 MED ORDER — LOSARTAN POTASSIUM 100 MG PO TABS: 100.0000 mg | ORAL_TABLET | Freq: Every day | ORAL | 0 refills | Status: AC

## 2024-10-12 ENCOUNTER — Encounter: Payer: Self-pay | Admitting: Family Medicine

## 2024-10-12 ENCOUNTER — Ambulatory Visit (INDEPENDENT_AMBULATORY_CARE_PROVIDER_SITE_OTHER): Admitting: Family Medicine

## 2024-10-12 VITALS — BP 116/80 | HR 69 | Temp 98.4°F | Ht 62.5 in | Wt 173.2 lb

## 2024-10-12 DIAGNOSIS — E871 Hypo-osmolality and hyponatremia: Secondary | ICD-10-CM

## 2024-10-12 DIAGNOSIS — R5383 Other fatigue: Secondary | ICD-10-CM

## 2024-10-12 DIAGNOSIS — K5900 Constipation, unspecified: Secondary | ICD-10-CM | POA: Diagnosis not present

## 2024-10-12 DIAGNOSIS — R350 Frequency of micturition: Secondary | ICD-10-CM

## 2024-10-12 LAB — COMPREHENSIVE METABOLIC PANEL WITH GFR
ALT: 23 U/L (ref 0–35)
AST: 21 U/L (ref 0–37)
Albumin: 4 g/dL (ref 3.5–5.2)
Alkaline Phosphatase: 59 U/L (ref 39–117)
BUN: 22 mg/dL (ref 6–23)
CO2: 26 meq/L (ref 19–32)
Calcium: 9.4 mg/dL (ref 8.4–10.5)
Chloride: 102 meq/L (ref 96–112)
Creatinine, Ser: 0.95 mg/dL (ref 0.40–1.20)
GFR: 54.24 mL/min — ABNORMAL LOW (ref >60.00)
Glucose, Bld: 102 mg/dL — ABNORMAL HIGH (ref 70–99)
Potassium: 4.3 meq/L (ref 3.5–5.1)
Sodium: 136 meq/L (ref 135–145)
Total Bilirubin: 0.3 mg/dL (ref 0.2–1.2)
Total Protein: 6 g/dL (ref 6.0–8.3)

## 2024-10-12 LAB — CBC WITH DIFFERENTIAL/PLATELET
Basophils Absolute: 0.1 K/uL (ref 0.0–0.1)
Basophils Relative: 0.9 % (ref 0.0–3.0)
Eosinophils Absolute: 0.2 K/uL (ref 0.0–0.7)
Eosinophils Relative: 3 % (ref 0.0–5.0)
HCT: 35.5 % — ABNORMAL LOW (ref 36.0–46.0)
Hemoglobin: 11.9 g/dL — ABNORMAL LOW (ref 12.0–15.0)
Lymphocytes Relative: 22.9 % (ref 12.0–46.0)
Lymphs Abs: 1.6 K/uL (ref 0.7–4.0)
MCHC: 33.6 g/dL (ref 30.0–36.0)
MCV: 93 fl (ref 78.0–100.0)
Monocytes Absolute: 0.7 K/uL (ref 0.1–1.0)
Monocytes Relative: 9.6 % (ref 3.0–12.0)
Neutro Abs: 4.4 K/uL (ref 1.4–7.7)
Neutrophils Relative %: 63.6 % (ref 43.0–77.0)
Platelets: 271 K/uL (ref 150.0–400.0)
RBC: 3.81 Mil/uL — ABNORMAL LOW (ref 3.87–5.11)
RDW: 13.8 % (ref 11.5–15.5)
WBC: 6.9 K/uL (ref 4.0–10.5)

## 2024-10-12 LAB — POCT INFLUENZA A/B
Influenza A, POC: NEGATIVE
Influenza B, POC: NEGATIVE

## 2024-10-12 LAB — POC COVID19 BINAXNOW: SARS Coronavirus 2 Ag: NEGATIVE

## 2024-10-12 LAB — TSH: TSH: 0.74 u[IU]/mL (ref 0.35–5.50)

## 2024-10-12 NOTE — Progress Notes (Signed)
 Patient ID: Renee Gilbert, female    DOB: 10-Oct-1936, 88 y.o.   MRN: [id number]  This visit was conducted in person.  BP 116/80   Pulse 69   Temp 98.4 F (36.9 C) (Oral)   Ht 5' 2.5 (1.588 m)   Wt 173 lb 4 oz (78.6 kg)   SpO2 97%   BMI 31.18 kg/m    CC:  Chief Complaint  Patient presents with   Urinary Frequency    C/o urinary frequency, fatigue, nasal drainage, abd pain and nausea. Sxs started 10/06/24. Seen at [facility] on 10/07/24, given UA and tx abx. H/o UTI.     Subjective:   HPI: Renee Gilbert is a 88 y.o. female patient of Mallie Gaskins presenting on 10/12/2024 for Urinary Frequency (C/o urinary frequency, fatigue, nasal drainage, abd pain and nausea. Sxs started 10/06/24. Seen at [facility] on 10/07/24, given UA and tx abx. H/o UTI. )  She presents with her son who assists with history.  Reviewed recent office visit note from urgent care on October 07, 2024 for recurrent UTI. Treated with antibiotics ( Bactrim  DS) given UA positive, but urine culture returned negative.  She has noted urinary frequency, abd pressure and nausea in the last week. She also is fatigued and has noted nasal drainage.    No dysuria, clear urine.  Abdominal pressure.SABRA associated with nausea.. pain gone now.  Constipation.SABRA last BM this AM.. straining.    ST,  congestion 2 days ago couhging, dry. Feels tired and foggy  No fever, no chills.  Using choloraseptic.   Has back ache but trying to move lots of stuff.. per son.   SE to keflex in past.  Some sick contacts...  On review of past labs.. very low sodium. 06/2023 128   Per son low sodium due to  prednisone in past year.. 3 courses.  Relevant past medical, surgical, family and social history reviewed and updated as indicated. Interim medical history since our last visit reviewed. Allergies and medications reviewed and updated. Outpatient Medications Prior to Visit  Medication Sig Dispense Refill   gabapentin  (NEURONTIN ) 100 MG capsule Take 2  capsules by mouth at bedtime for pain. 180 capsule 0   losartan  (COZAAR ) 100 MG tablet Take 1 tablet (100 mg total) by mouth daily. For blood pressure. 90 tablet 0   pantoprazole  (PROTONIX ) 40 MG tablet Take 1 tablet (40 mg total) by mouth daily. for heartburn. 90 tablet 0   pravastatin  (PRAVACHOL ) 40 MG tablet Take 1 tablet (40 mg total) by mouth daily. For cholesterol. 90 tablet 0   No facility-administered medications prior to visit.     Per HPI unless specifically indicated in ROS section below Review of Systems  Constitutional:  Positive for fatigue. Negative for fever.  HENT:  Positive for congestion and sore throat. Negative for sinus pressure, sinus pain and sneezing.   Eyes:  Negative for pain.  Respiratory:  Positive for cough. Negative for shortness of breath.   Cardiovascular:  Negative for chest pain, palpitations and leg swelling.  Gastrointestinal:  Positive for abdominal pain and constipation.  Genitourinary:  Positive for frequency, hematuria and urgency. Negative for decreased urine volume, dysuria, flank pain, pelvic pain, vaginal bleeding, vaginal discharge and vaginal pain.  Musculoskeletal:  Negative for back pain.  Neurological:  Negative for syncope, light-headedness and headaches.  Psychiatric/Behavioral:  Negative for dysphoric mood.    Objective:  BP 116/80   Pulse 69   Temp 98.4 F (36.9 C) (Oral)  Ht 5' 2.5 (1.588 m)   Wt 173 lb 4 oz (78.6 kg)   SpO2 97%   BMI 31.18 kg/m   Wt Readings from Last 3 Encounters:  10/12/24 173 lb 4 oz (78.6 kg)  07/15/23 179 lb (81.2 kg)  10/21/22 182 lb (82.6 kg)      Physical Exam Constitutional:      General: She is not in acute distress.    Appearance: Normal appearance. She is well-developed. She is not ill-appearing or toxic-appearing.  HENT:     Head: Normocephalic.     Right Ear: Hearing, tympanic membrane, ear canal and external ear normal. Tympanic membrane is not erythematous, retracted or bulging.      Left Ear: Hearing, tympanic membrane, ear canal and external ear normal. Tympanic membrane is not erythematous, retracted or bulging.     Nose: Congestion present. No mucosal edema or rhinorrhea.     Right Sinus: No maxillary sinus tenderness or frontal sinus tenderness.     Left Sinus: No maxillary sinus tenderness or frontal sinus tenderness.     Mouth/Throat:     Pharynx: Uvula midline.  Eyes:     General: Lids are normal. Lids are everted, no foreign bodies appreciated.     Conjunctiva/sclera: Conjunctivae normal.     Pupils: Pupils are equal, round, and reactive to light.  Neck:     Thyroid : No thyroid  mass or thyromegaly.     Vascular: No carotid bruit.     Trachea: Trachea normal.  Cardiovascular:     Rate and Rhythm: Normal rate and regular rhythm.     Pulses: Normal pulses.     Heart sounds: Normal heart sounds, S1 normal and S2 normal. No murmur heard.    No friction rub. No gallop.  Pulmonary:     Effort: Pulmonary effort is normal. No tachypnea or respiratory distress.     Breath sounds: Normal breath sounds. No decreased breath sounds, wheezing, rhonchi or rales.  Abdominal:     General: Bowel sounds are normal.     Palpations: Abdomen is soft.     Tenderness: There is no abdominal tenderness.  Musculoskeletal:     Cervical back: Normal range of motion and neck supple.  Skin:    General: Skin is warm and dry.     Findings: No rash.  Neurological:     Mental Status: She is alert.  Psychiatric:        Mood and Affect: Mood is not anxious or depressed.        Speech: Speech normal.        Behavior: Behavior normal. Behavior is cooperative.        Thought Content: Thought content normal.        Judgment: Judgment normal.       Results for orders placed or performed in visit on 10/12/24  POCT Influenza A/B   Collection Time: 10/12/24 12:08 PM  Result Value Ref Range   Influenza A, POC Negative Negative   Influenza B, POC Negative Negative  POC COVID-19 BinaxNow    Collection Time: 10/12/24 12:09 PM  Result Value Ref Range   SARS Coronavirus 2 Ag Negative Negative    Assessment and Plan  Urinary frequency Assessment & Plan: Acute, possibly 1 chronic.  No associated additional urinary symptoms such as dysuria. Recent urinalysis concerning but likely contaminated.  Urine culture returned negative for infection.  Not surprising that Bactrim  did not help with symptoms.  I wonder if some of her urinary frequency  is more related to overactive bowel potentially due to the constipation she reports today in the office.   Other fatigue Assessment & Plan: Acute, COVID and flu test negative in office today.  Will move forward with evaluation. Difficult to tell what symptoms are more chronic versus acute.  Son feels that brain fog has been ongoing since COVID and that she may have some post-COVID symptoms ongoing for the past several years.  Orders: -     POC COVID-19 BinaxNow -     POCT Influenza A/B -     CBC with Differential/Platelet -     TSH -     Comprehensive metabolic panel with GFR  Constipation, unspecified constipation type Assessment & Plan: Acute, increase water and fiber in diet.  She has been eating a lot of fast food with the recent travel. Start MiraLAX 17 g p.o. daily.  Hopefully improving bowel movements will also help with nausea abdominal pressure and urinary changes. Of note no abdominal pain on exam today.  Return and ER precautions provided.   Hyponatremia Assessment & Plan: Per son. It was felt in the past by her previous PCP to be due to frequent prednisone courses in the last 6 months.  We will reevaluate her electrolytes today.     No follow-ups on file.   Greig Ring, MD

## 2024-10-12 NOTE — Patient Instructions (Addendum)
 Start Miralax 17 gm daily. Continue water intake and fiber.  Can use tylenol ES 2 tablets every eight hours for sore throat, back ache.

## 2024-10-12 NOTE — Assessment & Plan Note (Signed)
 Acute, increase water and fiber in diet.  She has been eating a lot of fast food with the recent travel. Start MiraLAX 17 g p.o. daily.  Hopefully improving bowel movements will also help with nausea abdominal pressure and urinary changes. Of note no abdominal pain on exam today.  Return and ER precautions provided.

## 2024-10-12 NOTE — Assessment & Plan Note (Signed)
 Acute, COVID and flu test negative in office today.  Will move forward with evaluation. Difficult to tell what symptoms are more chronic versus acute.  Son feels that brain fog has been ongoing since COVID and that she may have some post-COVID symptoms ongoing for the past several years.

## 2024-10-12 NOTE — Assessment & Plan Note (Signed)
 Acute, possibly 1 chronic.  No associated additional urinary symptoms such as dysuria. Recent urinalysis concerning but likely contaminated.  Urine culture returned negative for infection.  Not surprising that Bactrim  did not help with symptoms.  I wonder if some of her urinary frequency is more related to overactive bowel potentially due to the constipation she reports today in the office.

## 2024-10-12 NOTE — Assessment & Plan Note (Signed)
 Per son. It was felt in the past by her previous PCP to be due to frequent prednisone courses in the last 6 months.  We will reevaluate her electrolytes today.

## 2024-10-13 ENCOUNTER — Ambulatory Visit: Payer: Self-pay | Admitting: Family Medicine
